# Patient Record
Sex: Female | Born: 1960
Health system: Southern US, Community
[De-identification: ages and names within clinical notes are randomized; demographics above are authoritative.]

## PROBLEM LIST (undated history)

## (undated) DIAGNOSIS — E785 Hyperlipidemia, unspecified: Secondary | ICD-10-CM

## (undated) DIAGNOSIS — I10 Essential (primary) hypertension: Secondary | ICD-10-CM

## (undated) DIAGNOSIS — G9332 Myalgic encephalomyelitis/chronic fatigue syndrome: Secondary | ICD-10-CM

## (undated) DIAGNOSIS — R5382 Chronic fatigue, unspecified: Secondary | ICD-10-CM

## (undated) DIAGNOSIS — R5383 Other fatigue: Secondary | ICD-10-CM

## (undated) DIAGNOSIS — E039 Hypothyroidism, unspecified: Secondary | ICD-10-CM

## (undated) DIAGNOSIS — R7303 Prediabetes: Secondary | ICD-10-CM

## (undated) DIAGNOSIS — E049 Nontoxic goiter, unspecified: Secondary | ICD-10-CM

## (undated) HISTORY — DX: Chronic fatigue, unspecified: R53.82

## (undated) HISTORY — DX: Essential (primary) hypertension: I10

## (undated) HISTORY — DX: Other fatigue: R53.83

## (undated) HISTORY — DX: Hyperlipidemia, unspecified: E78.5

## (undated) HISTORY — DX: Myalgic encephalomyelitis/chronic fatigue syndrome: G93.32

## (undated) HISTORY — PX: TONSILLECTOMY: SUR1361

## (undated) HISTORY — PX: FOOT SURGERY: SHX648

## (undated) HISTORY — DX: Prediabetes: R73.03

## (undated) HISTORY — DX: Hypothyroidism, unspecified: E03.9

## (undated) HISTORY — DX: Nontoxic goiter, unspecified: E04.9

---

## 2007-04-10 ENCOUNTER — Other Ambulatory Visit: Admission: RE | Admit: 2007-04-10 | Discharge: 2007-04-10 | Payer: Self-pay | Admitting: Obstetrics and Gynecology

## 2008-05-13 ENCOUNTER — Other Ambulatory Visit: Admission: RE | Admit: 2008-05-13 | Discharge: 2008-05-13 | Payer: Self-pay | Admitting: Obstetrics and Gynecology

## 2012-07-30 ENCOUNTER — Encounter: Payer: Self-pay | Admitting: Obstetrics & Gynecology

## 2012-07-31 ENCOUNTER — Ambulatory Visit: Payer: Self-pay | Admitting: Obstetrics & Gynecology

## 2012-07-31 ENCOUNTER — Ambulatory Visit (INDEPENDENT_AMBULATORY_CARE_PROVIDER_SITE_OTHER): Payer: Managed Care, Other (non HMO) | Admitting: Obstetrics & Gynecology

## 2012-07-31 ENCOUNTER — Encounter: Payer: Self-pay | Admitting: Obstetrics & Gynecology

## 2012-07-31 VITALS — BP 94/46 | HR 58 | Resp 12 | Ht 63.75 in | Wt 149.4 lb

## 2012-07-31 DIAGNOSIS — Z01419 Encounter for gynecological examination (general) (routine) without abnormal findings: Secondary | ICD-10-CM

## 2012-07-31 DIAGNOSIS — Z0000 Encounter for general adult medical examination without abnormal findings: Secondary | ICD-10-CM

## 2012-07-31 LAB — COMPREHENSIVE METABOLIC PANEL
BUN: 13 mg/dL (ref 6–23)
CO2: 27 mEq/L (ref 19–32)
Calcium: 9.2 mg/dL (ref 8.4–10.5)
Chloride: 102 mEq/L (ref 96–112)
Creat: 0.8 mg/dL (ref 0.50–1.10)
Total Bilirubin: 0.9 mg/dL (ref 0.3–1.2)

## 2012-07-31 LAB — POCT URINALYSIS DIPSTICK
Leukocytes, UA: NEGATIVE
pH, UA: 5.5

## 2012-07-31 LAB — LIPID PANEL
Cholesterol: 184 mg/dL (ref 0–200)
HDL: 57 mg/dL (ref >39)
Triglycerides: 78 mg/dL (ref <150)

## 2012-07-31 LAB — TSH: TSH: 1.026 u[IU]/mL (ref 0.350–4.500)

## 2012-07-31 NOTE — Patient Instructions (Signed)

## 2012-07-31 NOTE — Progress Notes (Signed)
52 y.o. [id number] Unknown Caucasian Fe here for annual exam.  Has been very regular in regards to cycle except for May--two cycles that month.  None this month.  Will check thyroid today.  Reports about one month ago had URI, fever, and was on Abx.  Also noted tender enlargement under arm.  Went away after finishing abx.  She has MMG today.    Patient's last menstrual period was 06/25/2012.          Sexually active: yes  The current method of family planning is none.    Exercising: yes  walking and weights  Smoker:  no  Health Maintenance: Pap:  07/10/2011 nl Pap and negative HPV MMG:  07/31/2012 Colonoscopy:  05/30/2010, f/U 5 years Dr. Sabino Dick BMD:   never TDaP:  Pt unsure  Labs: Hgb- 13.3    reports that she has never smoked. She has never used smokeless tobacco. She reports that she drinks about 0.6 ounces of alcohol per week. She reports that she does not use illicit drugs.  Past Medical History  Diagnosis Date  . Hypothyroid     Goiter  . Goiter     History reviewed. No pertinent past surgical history.  Current Outpatient Prescriptions  Medication Sig Dispense Refill  . Calcium 600 MG tablet Take 600 mg by mouth.      . cholecalciferol (VITAMIN D) 1000 UNITS tablet Take 1,000 Units by mouth daily.      [name] levothyroxine (SYNTHROID) 50 MCG tablet Take 50 mcg by mouth daily before breakfast.      . Multiple Vitamins-Minerals (MULTIVITAMIN PO) Take by mouth.       No current facility-administered medications for this visit.    Family History  Problem Relation Age of Onset  . Hypertension Father   . Colon cancer Maternal Grandmother   . Diabetes Paternal Grandmother     ROS:  Pertinent items are noted in HPI.  Otherwise, a comprehensive ROS was negative.  Exam:   BP 94/46  Pulse 58  Resp 12  Ht 5' 3.75" (1.619 m)  Wt 149 lb 6.4 oz (67.767 kg)  BMI 25.85 kg/m2  LMP 06/25/2012 Height: 5' 3.75" (161.9 cm)  Ht Readings from Last 3 Encounters:  07/31/12 5' 3.75" (1.619 m)     General appearance: alert, cooperative and appears stated age Head: Normocephalic, without obvious abnormality, atraumatic Neck: no adenopathy, supple, symmetrical, trachea midline and thyroid enlarged and right lobe > left Lungs: clear to auscultation bilaterally Breasts: normal appearance, no masses or tenderness Heart: regular rate and rhythm Abdomen: soft, non-tender; no masses,  no organomegaly Extremities: extremities normal, atraumatic, no cyanosis or edema Skin: Skin color, texture, turgor normal. No rashes or lesions Lymph nodes: Cervical, supraclavicular, and axillary nodes normal. No abnormal inguinal nodes palpated Neurologic: Grossly normal   Pelvic: External genitalia:  no lesions              Urethra:  normal appearing urethra with no masses, tenderness or lesions              Bartholin's and Skene's: normal                 Vagina: normal appearing vagina with normal color and discharge, no lesions              Cervix: no lesions              Pap taken: no Bimanual Exam:  Uterus:  normal size, contour, position, consistency, mobility, non-tender  Adnexa: normal adnexa and no mass, fullness, tenderness               Rectovaginal: Confirms               Anus:  normal sphincter tone, no lesions  A:  Well Woman with normal exam Hypothyroidism Enlarged thyroid R>L , U/S 2013 confirmed physical exam findings.  Was similar to tyroid ultrasound in paper chart from 2001  P:   Pap smear with neg HR HPV one year ago  Mammogram today FLP, TSH, CMP return annually or prn  An After Visit Summary was printed and given to the patient.

## 2012-08-01 ENCOUNTER — Telehealth: Payer: Self-pay

## 2012-08-01 NOTE — Telephone Encounter (Signed)
Patient notified of all results. 

## 2012-08-01 NOTE — Telephone Encounter (Signed)
Message copied by Elisha Headland on Thu Aug 01, 2012 10:03 AM ------      Message from: Jerene Bears      Created: Thu Aug 01, 2012  9:10 AM       Inform pt labs are good.  Alk phos is low--this is nothing to worry about.  LDL 111 but total good, HDLs good. ------

## 2012-08-01 NOTE — Telephone Encounter (Signed)
6/25 lm with husband to have patient call back//kn

## 2012-08-02 ENCOUNTER — Telehealth: Payer: Self-pay | Admitting: Obstetrics & Gynecology

## 2012-08-02 ENCOUNTER — Ambulatory Visit (INDEPENDENT_AMBULATORY_CARE_PROVIDER_SITE_OTHER): Payer: Managed Care, Other (non HMO) | Admitting: Obstetrics & Gynecology

## 2012-08-02 ENCOUNTER — Encounter: Payer: Self-pay | Admitting: Obstetrics & Gynecology

## 2012-08-02 DIAGNOSIS — E039 Hypothyroidism, unspecified: Secondary | ICD-10-CM

## 2012-08-02 MED ORDER — LEVOTHYROXINE SODIUM 50 MCG PO TABS: 75.0000 ug | ORAL_TABLET | Freq: Every day | ORAL | Status: DC

## 2012-08-02 MED ORDER — LEVOTHYROXINE SODIUM 75 MCG PO TABS: 75.0000 ug | ORAL_TABLET | Freq: Every day | ORAL | Status: DC

## 2012-08-02 NOTE — Telephone Encounter (Signed)
Patient wants to speak with the nurse re: Synthroid RX. Patient states wrong dosage written on AVS from last visit.

## 2012-08-02 NOTE — Progress Notes (Signed)
Rx for Levothyroxine to [facility] pharmacy

## 2012-08-02 NOTE — Telephone Encounter (Signed)
Spoke with pt about synthroid dosage. Pt says she takes 75 mcg daily, but her AVS stated .50. Pt confused. Advised pt that the correct dose is in the computer and was refilled correctly, so the AVS must just be a typo. Pt appreciative.

## 2012-09-20 ENCOUNTER — Ambulatory Visit (INDEPENDENT_AMBULATORY_CARE_PROVIDER_SITE_OTHER): Payer: Managed Care, Other (non HMO) | Admitting: Obstetrics & Gynecology

## 2012-09-20 ENCOUNTER — Encounter: Payer: Self-pay | Admitting: Obstetrics & Gynecology

## 2012-09-20 VITALS — BP 116/68 | HR 64 | Resp 16 | Ht 63.75 in | Wt 149.2 lb

## 2012-09-20 DIAGNOSIS — N9489 Other specified conditions associated with female genital organs and menstrual cycle: Secondary | ICD-10-CM

## 2012-09-20 DIAGNOSIS — N9089 Other specified noninflammatory disorders of vulva and perineum: Secondary | ICD-10-CM

## 2012-09-20 DIAGNOSIS — A630 Anogenital (venereal) warts: Secondary | ICD-10-CM

## 2012-09-20 MED ORDER — CEPHALEXIN 500 MG PO CAPS: 500.0000 mg | ORAL_CAPSULE | Freq: Four times a day (QID) | ORAL | Status: DC

## 2012-09-20 NOTE — Patient Instructions (Signed)

## 2012-09-20 NOTE — Progress Notes (Signed)
53 y.o. MWF here for removal of left labial lesion.  This appears to be a wart but she knows I will sent to pathology for final analysis.  On cycle today.  Procedure described to patient.  Informed consent obtained.  All questions answered before proceeding.    Exam:   BP 116/68  Pulse 64  Resp 16  Ht 5' 3.75" (1.619 m)  Wt 149 lb 3.2 oz (67.677 kg)  BMI 25.82 kg/m2  LMP 09/17/2012 General appearance: alert and cooperative Lymph:  Inguinal adenopathy: neg  External genitalia:  Raised, warty lesion on left inferior aspect of labia majora  Procedure:  Area cleansed with Betadine.  Sterile technique used throughout procedure.  Skin anesthestized with Lidocaine 1% plain;  1mL.  Total amount used:  1 cc's.  Lesion elevated with pick-ups and excised fully with sterile scissors.  Lesion about 5mm in size.  Two figure of eight sutures required for excellent hemostasis.  No dressing applied.  Patient tolerated procedure well.    Specimen(s) labeled as labia majora right and sent to pathology  Assessment:  Vulvar lesion, consistent with condyloma  Plan:  Return for suture removal 1 week Keflex 500mg  qid x 5 days. Instructions provided

## 2012-09-24 ENCOUNTER — Telehealth: Payer: Self-pay

## 2012-09-24 NOTE — Telephone Encounter (Signed)
lmtcb

## 2012-09-24 NOTE — Telephone Encounter (Signed)
Message copied by Elisha Headland on Tue Sep 24, 2012  2:19 PM ------      Message from: Jerene Bears      Created: Mon Sep 23, 2012  4:30 PM       Inform pathology showed a wart.  This is what she and I both thought it was.  She is coming for suture removal Thursday pm. ------

## 2012-09-24 NOTE — Telephone Encounter (Signed)
Patient notified of all results. Will f/u on Thurs for suture removal.

## 2012-09-26 ENCOUNTER — Ambulatory Visit (INDEPENDENT_AMBULATORY_CARE_PROVIDER_SITE_OTHER): Payer: Managed Care, Other (non HMO) | Admitting: Obstetrics & Gynecology

## 2012-09-26 ENCOUNTER — Encounter: Payer: Self-pay | Admitting: Obstetrics & Gynecology

## 2012-09-26 VITALS — BP 110/64 | HR 64 | Resp 16 | Ht 63.75 in | Wt 148.0 lb

## 2012-09-26 DIAGNOSIS — A630 Anogenital (venereal) warts: Secondary | ICD-10-CM

## 2012-09-26 DIAGNOSIS — R635 Abnormal weight gain: Secondary | ICD-10-CM

## 2012-09-27 ENCOUNTER — Other Ambulatory Visit: Payer: Self-pay | Admitting: Obstetrics & Gynecology

## 2012-09-27 NOTE — Progress Notes (Signed)
Subjective:     Patient ID: Deborah Miranda, female   DOB: Jun 07, 1960, 52 y.o.   MRN: [id number]  HPI 52 yo MWF here for suture removal after excision of vulvar lesion.  Pathology showed condyloma.  D/W pt.  Many questions.  All answered.    She wants to have additional thyroid testing done due to weight gain.  Feels TSH is not enough.  Has been reading book about T3.  Asks is needs referral for this test.    Review of Systems  All other systems reviewed and are negative.       Objective:   Physical Exam  Constitutional: She appears well-developed and well-nourished.  Genitourinary:          Assessment:     Vulvar Condyloma Weight gain     Plan:     Return for AEX Thyroid panel ordered

## 2012-09-28 LAB — THYROID PROFILE - CHCC: T4, Total: 8.7 ug/dL (ref 5.0–12.5)

## 2012-10-01 ENCOUNTER — Telehealth: Payer: Self-pay

## 2012-10-01 NOTE — Telephone Encounter (Signed)
Message copied by Elisha Headland on Tue Oct 01, 2012  3:49 PM ------      Message from: Jerene Bears      Created: Sat Sep 28, 2012  8:00 PM       Please inform thyroid panel is completely normal. She does not have hypothyroidism. ------

## 2012-10-01 NOTE — Telephone Encounter (Signed)
Patient returning your phone call.   

## 2012-10-01 NOTE — Telephone Encounter (Signed)
8/26 lmtcb

## 2012-10-03 NOTE — Telephone Encounter (Signed)
Returning a call to Kelly °

## 2012-10-04 NOTE — Telephone Encounter (Signed)
Patient returning Kelly's call. °

## 2012-10-04 NOTE — Telephone Encounter (Signed)
Patient aware of all results. 

## 2013-08-15 ENCOUNTER — Other Ambulatory Visit: Payer: Self-pay | Admitting: Obstetrics & Gynecology

## 2013-08-18 NOTE — Telephone Encounter (Signed)
Last refilled: 08/02/12 #90/4 refills Last AEX: 07/31/13 with Dr. Hyacinth MeekerMiller Last Level Check 07/31/12 at 1.026 AEX scheduled for 09/26/13 with Dr. Hyacinth MeekerMiller Synthroid 75 mcg #90/0 refills sent to pharmacy to last patient until AEX

## 2013-09-09 ENCOUNTER — Other Ambulatory Visit: Payer: Self-pay | Admitting: Obstetrics & Gynecology

## 2013-09-09 NOTE — Telephone Encounter (Addendum)
Rx refilled on 08/18/13 #90, 1 refill. Pt should have enough to last until her AEX on 09/26/13

## 2013-09-26 ENCOUNTER — Encounter: Payer: Self-pay | Admitting: Obstetrics & Gynecology

## 2013-09-26 ENCOUNTER — Ambulatory Visit (INDEPENDENT_AMBULATORY_CARE_PROVIDER_SITE_OTHER): Payer: BC Managed Care – PPO | Admitting: Obstetrics & Gynecology

## 2013-09-26 VITALS — BP 122/78 | HR 60 | Resp 16 | Ht 64.0 in | Wt 150.8 lb

## 2013-09-26 DIAGNOSIS — Z0000 Encounter for general adult medical examination without abnormal findings: Secondary | ICD-10-CM

## 2013-09-26 DIAGNOSIS — N9089 Other specified noninflammatory disorders of vulva and perineum: Secondary | ICD-10-CM

## 2013-09-26 DIAGNOSIS — Z124 Encounter for screening for malignant neoplasm of cervix: Secondary | ICD-10-CM

## 2013-09-26 DIAGNOSIS — E039 Hypothyroidism, unspecified: Secondary | ICD-10-CM

## 2013-09-26 DIAGNOSIS — Z01419 Encounter for gynecological examination (general) (routine) without abnormal findings: Secondary | ICD-10-CM

## 2013-09-26 LAB — POCT URINALYSIS DIPSTICK
Bilirubin, UA: NEGATIVE
Blood, UA: NEGATIVE
GLUCOSE UA: NEGATIVE
LEUKOCYTES UA: NEGATIVE
NITRITE UA: NEGATIVE
Protein, UA: NEGATIVE
UROBILINOGEN UA: NEGATIVE
pH, UA: 5

## 2013-09-26 LAB — HEMOGLOBIN, FINGERSTICK: HEMOGLOBIN, FINGERSTICK: 13.8 g/dL (ref 12.0–16.0)

## 2013-09-26 MED ORDER — LEVOTHYROXINE SODIUM 75 MCG PO TABS
ORAL_TABLET | ORAL | Status: DC
Start: 2013-09-26 — End: 2014-10-01

## 2013-09-26 NOTE — Progress Notes (Signed)
53 y.o. G92P2002 Married CaucasianF here for annual exam.  Hopefully going to the [place] to an all inclusive resort.  This is her preferred way of traveling.  Cycles are completely normal and regular, lasting 4 days.  Has place on her right inner thigh she'd like me to look at.  It is very close to the crease in her leg/groin area.  Pt reports it is pigmented but a few weeks ago became much larger and "swollen".  She would like this removed if possible.   Patient's last menstrual period was 09/17/2013.          Sexually active: Yes.    The current method of family planning is none.    Exercising: Yes.     Smoker:  no  Health Maintenance: Pap:  07/06/11 WNL/negative HR HPV History of abnormal Pap:  no MMG:  09/26/13 Colonoscopy:  05/30/10-repeat in 5 years BMD:   none TDaP:  Up to date Screening Labs: today, Hb today: today, Urine today: KETONES-trace   reports that she has never smoked. She has never used smokeless tobacco. She reports that she does not drink alcohol or use illicit drugs.  Past Medical History  Diagnosis Date  . Hypothyroid     Goiter  . Goiter     Past Surgical History  Procedure Laterality Date  . Foot surgery      pinkie toe    Current Outpatient Prescriptions  Medication Sig Dispense Refill  . Calcium 600 MG tablet Take 600 mg by mouth.      . cholecalciferol (VITAMIN D) 1000 UNITS tablet Take 1,000 Units by mouth daily.      . Multiple Vitamins-Minerals (MULTIVITAMIN PO) Take by mouth.      . SYNTHROID 75 MCG tablet TAKE 1 TABLET BY MOUTH EVERY DAY BEFORE BREAKFAST  90 tablet  1   No current facility-administered medications for this visit.    Family History  Problem Relation Age of Onset  . Hypertension Father   . Colon cancer Maternal Grandmother   . Cancer Maternal Grandmother     colon cancer  . Diabetes Paternal Grandmother     ROS:  Pertinent items are noted in HPI.  Otherwise, a comprehensive ROS was negative.  Exam:   BP  122/78  Pulse 60  Resp 16  Ht 5\' 4"  (1.626 m)  Wt 150 lb 12.8 oz (68.402 kg)  BMI 25.87 kg/m2  LMP 09/17/2013  Weight change: stable   Height: 5\' 4"  (162.6 cm)  Ht Readings from Last 3 Encounters:  09/26/13 5\' 4"  (1.626 m)  09/26/12 5' 3.75" (1.619 m)  09/20/12 5' 3.75" (1.619 m)    General appearance: alert, cooperative and appears stated age Head: Normocephalic, without obvious abnormality, atraumatic Neck: no adenopathy, supple, symmetrical, trachea midline and thyroid normal to inspection and palpation Lungs: clear to auscultation bilaterally Breasts: normal appearance, no masses or tenderness Heart: regular rate and rhythm Abdomen: soft, non-tender; bowel sounds normal; no masses,  no organomegaly Extremities: extremities normal, atraumatic, no cyanosis or edema Skin: Skin color, texture, turgor normal. No rashes or lesions Lymph nodes: Cervical, supraclavicular, and axillary nodes normal. No abnormal inguinal nodes palpated Neurologic: Grossly normal   Pelvic: External genitalia:  no lesions except for raised and irregular reddish lesion right inner thigh              Urethra:  normal appearing urethra with no masses, tenderness or lesions  Bartholins and Skenes: normal                 Vagina: normal appearing vagina with normal color and discharge, no lesions              Cervix: no lesions              Pap taken: Yes.   Bimanual Exam:  Uterus:  normal size, contour, position, consistency, mobility, non-tender              Adnexa: normal adnexa and no mass, fullness, tenderness               Rectovaginal: Confirms               Anus:  normal sphincter tone, no lesions  Procedure:  Area on right thigh cleansed with Betadine x 3.  Using sterile techinque, lesion excision with sterile pick-ups and scissors.  Two interrupted sutures of #4.0 vicryl placed.  Hemostasis present.  Pt tolerated procedure well.  Dressing applied.  A:  Well Woman with normal  exam Hypothyroidism S/p excision of right inner thigh lesion  P:   Mammogram yearly pap smear obtained today.  Neg pap with neg HR HPV 2013 Vit D, TSH, CMP, Lipids today Pathology results will be called to pt.  Stitches will need to be removed 7-10 days or when area is itching (due to healing) return annually or prn  An After Visit Summary was printed and given to the patient.

## 2013-09-27 LAB — COMPREHENSIVE METABOLIC PANEL WITH GFR
ALT: 11 U/L (ref 0–35)
AST: 16 U/L (ref 0–37)
Albumin: 4.6 g/dL (ref 3.5–5.2)
Alkaline Phosphatase: 40 U/L (ref 39–117)
BUN: 13 mg/dL (ref 6–23)
CO2: 27 meq/L (ref 19–32)
Calcium: 9.3 mg/dL (ref 8.4–10.5)
Chloride: 100 meq/L (ref 96–112)
Creat: 0.68 mg/dL (ref 0.50–1.10)
Glucose, Bld: 73 mg/dL (ref 70–99)
Potassium: 4 meq/L (ref 3.5–5.3)
Sodium: 139 meq/L (ref 135–145)
Total Bilirubin: 1.3 mg/dL — ABNORMAL HIGH (ref 0.2–1.2)
Total Protein: 7 g/dL (ref 6.0–8.3)

## 2013-09-27 LAB — LIPID PANEL
Cholesterol: 234 mg/dL — ABNORMAL HIGH (ref 0–200)
HDL: 72 mg/dL
LDL Cholesterol: 146 mg/dL — ABNORMAL HIGH (ref 0–99)
Total CHOL/HDL Ratio: 3.3 ratio
Triglycerides: 79 mg/dL
VLDL: 16 mg/dL (ref 0–40)

## 2013-09-27 LAB — TSH: TSH: 2.889 u[IU]/mL (ref 0.350–4.500)

## 2013-09-27 LAB — VITAMIN D 25 HYDROXY (VIT D DEFICIENCY, FRACTURES): Vit D, 25-Hydroxy: 79 ng/mL (ref 30–89)

## 2013-09-28 ENCOUNTER — Encounter: Payer: Self-pay | Admitting: Obstetrics & Gynecology

## 2013-09-28 DIAGNOSIS — E039 Hypothyroidism, unspecified: Secondary | ICD-10-CM | POA: Insufficient documentation

## 2013-09-30 LAB — IPS PAP TEST WITH REFLEX TO HPV

## 2013-10-03 ENCOUNTER — Telehealth: Payer: Self-pay

## 2013-10-03 NOTE — Telephone Encounter (Signed)
Message copied by Elisha Headland on Fri Oct 03, 2013 12:33 PM ------      Message from: Jerene Bears      Created: Thu Oct 02, 2013  8:35 AM       Inform pt pap negative.  Excised lesion was another condyloma with some mild dysplasia in it.  Nothing else needs to be done unless returns.  CMP showed bilirubin level mildly elevated.  Normal last year so will just recheck in one year.  Lipids mildly elevated, specifically LDL.  HDLs and TGs good.  Ratio normal.  Will continue to watch.  Really should try and check fasting one year.  She comes from [place] so this may be hard.  TSH normal.  Vit D normal.  If she is taking any supplements with Vit D, she should decrease by 1/2 as level is high normal.  If not on supplements, he body just makes a good amount and that is fine. ------

## 2013-10-03 NOTE — Telephone Encounter (Signed)
Lmtcb//kn 

## 2013-10-30 NOTE — Telephone Encounter (Signed)
Patient calling to speak with Deborah Miranda stating she did not receive her lab results in the mail.

## 2013-12-08 ENCOUNTER — Encounter: Payer: Self-pay | Admitting: Obstetrics & Gynecology

## 2013-12-22 NOTE — Telephone Encounter (Signed)
Patient sent in signed release-faxed to provider.//kn

## 2013-12-22 NOTE — Telephone Encounter (Signed)
Patient notified of all results earlier.//kn

## 2014-04-16 ENCOUNTER — Telehealth: Payer: Self-pay | Admitting: Obstetrics & Gynecology

## 2014-04-16 NOTE — Telephone Encounter (Signed)
Patient wants to talk with the nurse about having her hormone levels checked.

## 2014-04-16 NOTE — Telephone Encounter (Signed)
Spoke with patient. Patient states "I have been working out and eating right and I can not lose weight. I read that it could be related to my hormone levels. I just want to have everything checked." Advised patient will need to be seen in office with Dr.Miller for evaluation and blood work. Patient is agreeable. Appointment scheduled for 3/24 at 10:45am with Dr.Miller. Patient is agreeable to date and time.  Routing to provider for final review. Patient agreeable to disposition. Will close encounter

## 2014-04-30 ENCOUNTER — Ambulatory Visit (INDEPENDENT_AMBULATORY_CARE_PROVIDER_SITE_OTHER): Payer: BLUE CROSS/BLUE SHIELD | Admitting: Obstetrics & Gynecology

## 2014-04-30 VITALS — BP 134/80 | HR 64 | Resp 16 | Wt 162.0 lb

## 2014-04-30 DIAGNOSIS — R635 Abnormal weight gain: Secondary | ICD-10-CM | POA: Diagnosis not present

## 2014-04-30 DIAGNOSIS — N951 Menopausal and female climacteric states: Secondary | ICD-10-CM | POA: Diagnosis not present

## 2014-04-30 DIAGNOSIS — R5383 Other fatigue: Secondary | ICD-10-CM | POA: Diagnosis not present

## 2014-04-30 NOTE — Progress Notes (Signed)
Patient ID: Deborah Miranda, female   DOB: 09-05-1960, 54 y.o.   MRN: [id number]  54 yo G2P2 MWF here for discussion of desired weight loss.  Reports she is exercising really regularly.  She is doing the eliptical almost every morning.  She is walking in the evening a couple of times a week--walks 2 miles--as well as Yoga a few times a week.  She is just frustrated by weight changes.  Pt has changed 12 pounds in the last year.  Also, she is watching her diet carefully.  Pt does not drink sodas.  Also just has ETOH socially.    She reports she does feel fatigue a lot.  She doesn't have the best sleep habits.  She reports she can fall asleep really easily but wakes up between 2-4am and has trouble getting back to sleep.  Took Melatonin but made her too groggy during the day so she stopped it.    Pt has been menopausal for several years and is not on HRT.  She has never wanted to be on it due to risks.  States she wants hormonal testing as she thinks her hormones must be "out of wack".  Discussed with pt they will all be low as she is not making any--therefore, menopausal.  She understands this but still wants to make sure they are in the "normal" range for where she is in menopause.  Pt really wants to lose 10 pounds.  Has husband with her to help vouch for her good eating habits.  D/W pt phentermine use.  Could start at 15mg  dosage and use daily.  Risks of hypertension, pulmonary hypertension, dizziness, insomnia, stroke all discussed.  She will need to be seen every 4-8 weeks while on this.  Pt and I agree to wait and see what lab results show first.  Assessment:  Menopausal weight gain Fatigue  Plan:  Estradiol and Progesterone levels Ferritin, TSH and panel, B 12 If above is in appropriate ranges, will consider phentermine 15mg  daily.    ~15 minutes spent with patient >50% of time was in face to face discussion of above.

## 2014-05-01 LAB — PROGESTERONE: Progesterone: 0.5 ng/mL

## 2014-05-01 LAB — THYROID PANEL WITH TSH
Free Thyroxine Index: 2.4 (ref 1.4–3.8)
T3 UPTAKE: 30 % (ref 22–35)
T4, Total: 7.9 ug/dL (ref 4.5–12.0)
TSH: 1.609 u[IU]/mL (ref 0.350–4.500)

## 2014-05-01 LAB — FERRITIN: Ferritin: 15 ng/mL (ref 10–291)

## 2014-05-01 LAB — VITAMIN B12: Vitamin B-12: 604 pg/mL (ref 211–911)

## 2014-05-01 LAB — ESTRADIOL: ESTRADIOL: 43.7 pg/mL

## 2014-05-06 ENCOUNTER — Telehealth: Payer: Self-pay | Admitting: Obstetrics & Gynecology

## 2014-05-06 MED ORDER — PHENTERMINE HCL 15 MG PO CAPS: 15.0000 mg | ORAL_CAPSULE | ORAL | Status: DC

## 2014-05-06 NOTE — Telephone Encounter (Signed)
Rx for phentermine 15 mg daily #30 1RF to Dr.Silva for review and signature as Dr.Miller is out of the office today.

## 2014-05-06 NOTE — Telephone Encounter (Signed)
Rx was on TXU CorpKelly's desk and signed yesterday.  She may not have faxed it yesterday but it should be there.  Sorry Dr. Edward JollySilva had to do this.

## 2014-05-06 NOTE — Telephone Encounter (Signed)
Pt calling for test results.

## 2014-05-06 NOTE — Telephone Encounter (Signed)
Rx for Phentermine signed by me.

## 2014-05-06 NOTE — Telephone Encounter (Signed)
Addendum opened in error.

## 2014-05-06 NOTE — Telephone Encounter (Addendum)
Spoke with patient. Advised of results and message as seen below from Dr.Miller. Patient is agreeable and verbalizes understanding. Rx for Phentermine 15mg  daily #40 1RF sent to pharmacy on file. Patient is agreeable. Patient would like to call back tomorrow to schedule follow up appointment with Dr.Miller. "When I was in we also talked about me starting to take some herbs or vitamins. I do not know what kind though." Advised I will speak with Dr.Miller regarding recommendations and return call. Patient is agreeable. Patient requests labs be sent to home address on file. Lab flowsheet with results from 3/24 sent to patient's address on file.  Notes Recorded by Jerene BearsMary S Miller, MD on 05/05/2014 at 1:05 PM Please inform pt that b12, ferritin, thyroid panel were normal. Hormones were lower like I expected. We agreed to start PHentermine 15mg  daily. She will need follow up in 4-6 weeks after she starts for recheck with me. Thanks.

## 2014-05-06 NOTE — Telephone Encounter (Signed)
Prescription faxed to [facility] on file at [phone] with cover sheet and confirmation.  Routing to provider for final review. Patient agreeable to disposition. Will close encounter

## 2014-05-07 ENCOUNTER — Encounter: Payer: Self-pay | Admitting: Obstetrics & Gynecology

## 2014-05-15 ENCOUNTER — Telehealth: Payer: Self-pay | Admitting: Obstetrics & Gynecology

## 2014-05-15 NOTE — Telephone Encounter (Signed)
Left message to call Kaitlyn at [phone]. 

## 2014-05-15 NOTE — Telephone Encounter (Signed)
I would not stop for 3-4 weeks.  If she hasn't lost any weight over three to four weeks, then would advise stopping.

## 2014-05-15 NOTE — Telephone Encounter (Signed)
Pt says the medication Dr Hyacinth MeekerMiller prescribed at her last visit is not working. She thought she was going to get information on nutritional supplements as well.

## 2014-05-15 NOTE — Telephone Encounter (Signed)
Spoke with patient. Patient states that she has been taking Phentermine 15mg  daily and has not noticed a difference. "Nothing has changed at all. I have not lost any weight. I have been dieting and exercising and have not lost a single pound." Advised patient with it only being a week may need longer for this to take effect. "I do not want to keep taking it if it is not working." Advised I will speak with Dr.Miller regarding medication and return call. Patient is agreeable.

## 2014-05-18 NOTE — Telephone Encounter (Signed)
Advised patient of message from Dr. Hyacinth MeekerMiller. She will call back with update after 3-4 weeks.Routing to provider for final review. Patient agreeable to disposition. Will close encounter

## 2014-05-27 ENCOUNTER — Telehealth: Payer: Self-pay | Admitting: Obstetrics & Gynecology

## 2014-05-27 NOTE — Telephone Encounter (Signed)
Spoke with patient. She has not lost any weight at all on Phentermine 15 mg. She states she is taking every day and healthy diet with exercise. Calling to get advise from Dr. Hyacinth MeekerMiller.  Advised per last note, would advise to stop and patient agrees, but would like further information. Patient is wondering if she needs office visit or additional lab work?

## 2014-05-27 NOTE — Telephone Encounter (Signed)
Patient wants to talk with nurse. She states she thinks Dr. Hyacinth MeekerMiller wants her to come back in for a follow up or recheck appointment.

## 2014-05-28 NOTE — Telephone Encounter (Signed)
Called patient with message from Dr. Hyacinth MeekerMiller and she is agreeable. She will follow up prn. Routing to provider for final review. Patient agreeable to disposition. Will close encounter

## 2014-05-28 NOTE — Telephone Encounter (Signed)
She does not need additional blood work.  Should stop the medication if hasn't had any weight loss.  Could consider physician monitored weight loss facility.  I do not know what is close to her home, however.

## 2014-10-01 ENCOUNTER — Other Ambulatory Visit: Payer: Self-pay | Admitting: Obstetrics & Gynecology

## 2014-10-01 NOTE — Telephone Encounter (Signed)
Medication refill request: levothyroxine Last AEX:  09/26/13 with SM Next AEX: 10/02/14 with SM Last MMG (if hormonal medication request):  Refill authorized: please advise.

## 2014-10-02 ENCOUNTER — Ambulatory Visit (INDEPENDENT_AMBULATORY_CARE_PROVIDER_SITE_OTHER): Payer: 59 | Admitting: Obstetrics & Gynecology

## 2014-10-02 ENCOUNTER — Encounter: Payer: Self-pay | Admitting: Obstetrics & Gynecology

## 2014-10-02 VITALS — BP 122/80 | HR 64 | Resp 16 | Ht 64.5 in | Wt 156.0 lb

## 2014-10-02 DIAGNOSIS — Z0000 Encounter for general adult medical examination without abnormal findings: Secondary | ICD-10-CM

## 2014-10-02 DIAGNOSIS — Z124 Encounter for screening for malignant neoplasm of cervix: Secondary | ICD-10-CM

## 2014-10-02 DIAGNOSIS — N938 Other specified abnormal uterine and vaginal bleeding: Secondary | ICD-10-CM

## 2014-10-02 DIAGNOSIS — Z01419 Encounter for gynecological examination (general) (routine) without abnormal findings: Secondary | ICD-10-CM | POA: Diagnosis not present

## 2014-10-02 LAB — LIPID PANEL
CHOL/HDL RATIO: 3.3 ratio (ref <=5.0)
Cholesterol: 225 mg/dL — ABNORMAL HIGH (ref 125–200)
HDL: 69 mg/dL (ref >=46)
LDL Cholesterol: 141 mg/dL — ABNORMAL HIGH (ref <130)
Triglycerides: 76 mg/dL (ref <150)
VLDL: 15 mg/dL (ref <30)

## 2014-10-02 LAB — POCT URINALYSIS DIPSTICK
Bilirubin, UA: NEGATIVE
Blood, UA: NEGATIVE
Glucose, UA: NEGATIVE
KETONES UA: NEGATIVE
Leukocytes, UA: NEGATIVE
Nitrite, UA: NEGATIVE
PROTEIN UA: NEGATIVE
Urobilinogen, UA: NEGATIVE
pH, UA: 6.5

## 2014-10-02 LAB — TSH: TSH: 8.661 u[IU]/mL — ABNORMAL HIGH (ref 0.350–4.500)

## 2014-10-02 LAB — COMPREHENSIVE METABOLIC PANEL
ALBUMIN: 4.5 g/dL (ref 3.6–5.1)
ALT: 11 U/L (ref 6–29)
AST: 14 U/L (ref 10–35)
Alkaline Phosphatase: 45 U/L (ref 33–130)
BILIRUBIN TOTAL: 0.9 mg/dL (ref 0.2–1.2)
BUN: 13 mg/dL (ref 7–25)
CHLORIDE: 101 mmol/L (ref 98–110)
CO2: 28 mmol/L (ref 20–31)
CREATININE: 0.83 mg/dL (ref 0.50–1.05)
Calcium: 9.4 mg/dL (ref 8.6–10.4)
Glucose, Bld: 97 mg/dL (ref 65–99)
Potassium: 4.3 mmol/L (ref 3.5–5.3)
SODIUM: 138 mmol/L (ref 135–146)
Total Protein: 6.9 g/dL (ref 6.1–8.1)

## 2014-10-02 MED ORDER — PHENTERMINE HCL 15 MG PO CAPS: 15.0000 mg | ORAL_CAPSULE | ORAL | Status: DC

## 2014-10-02 MED ORDER — LEVOTHYROXINE SODIUM 75 MCG PO TABS: ORAL_TABLET | ORAL | Status: DC

## 2014-10-02 NOTE — Progress Notes (Signed)
54 y.o. [id number] MarriedCaucasianF here for annual exam.  Did skip some cycles this past year.  Cycle that occurred after this was very heavy.  Back to cycling monthly for several months.    Frustrated with weight.  Would like to try Phentermine again.  Only took two months.  She has seen a nutritionist.     Patient's last menstrual period was 09/05/2014.          Sexually active: Yes.    The current method of family planning is none.    Exercising: Yes.     Smoker:  no  Health Maintenance: Pap:  09/26/13 WNL-Endometrial cells on pap-LMP 09/17/13 History of abnormal Pap:  no MMG:  10/02/14--done this morning Colonoscopy:  05/30/10-repeat in 5 years BMD:   none TDaP:  UTD Screening Labs: today, Hb today: 13.5, Urine today: PH-6.5   reports that she has never smoked. She has never used smokeless tobacco. She reports that she does not drink alcohol or use illicit drugs.  Past Medical History  Diagnosis Date  . Hypothyroid     Goiter  . Goiter     Past Surgical History  Procedure Laterality Date  . Foot surgery      pinkie toe    Current Outpatient Prescriptions  Medication Sig Dispense Refill  . Calcium 600 MG tablet Take 600 mg by mouth.    . cholecalciferol (VITAMIN D) 1000 UNITS tablet Take 1,000 Units by mouth daily.    [name] levothyroxine (SYNTHROID, LEVOTHROID) 75 MCG tablet TAKE 1 TABLET BY MOUTH EVERY DAY BEFORE BREAKFAST 90 tablet 0  . Multiple Vitamins-Minerals (MULTIVITAMIN PO) Take by mouth.    . phentermine 15 MG capsule Take 1 capsule (15 mg total) by mouth every morning. (Patient not taking: Reported on 10/02/2014) 30 capsule 1   No current facility-administered medications for this visit.    Family History  Problem Relation Age of Onset  . Hypertension Father   . Colon cancer Maternal Grandmother   . Cancer Maternal Grandmother     colon cancer  . Diabetes Paternal Grandmother     ROS:  Pertinent items are noted in HPI.  Otherwise, a comprehensive ROS was  negative.  Exam:   BP 122/80 mmHg  Pulse 64  Resp 16  Ht 5' 4.5" (1.638 m)  Wt 156 lb (70.761 kg)  BMI 26.37 kg/m2  LMP 09/05/2014  Weight change: +6#   Height: 5' 4.5" (163.8 cm)  Ht Readings from Last 3 Encounters:  10/02/14 5' 4.5" (1.638 m)  09/26/13 5\' 4"  (1.626 m)  09/26/12 5' 3.75" (1.619 m)   General appearance: alert, cooperative and appears stated age Head: Normocephalic, without obvious abnormality, atraumatic Neck: no adenopathy, supple, symmetrical, trachea midline and thyroid normal to inspection and palpation Lungs: clear to auscultation bilaterally Breasts: normal appearance, no masses or tenderness Heart: regular rate and rhythm Abdomen: soft, non-tender; bowel sounds normal; no masses,  no organomegaly Extremities: extremities normal, atraumatic, no cyanosis or edema Skin: Skin color, texture, turgor normal. No rashes or lesions Lymph nodes: Cervical, supraclavicular, and axillary nodes normal. No abnormal inguinal nodes palpated Neurologic: Grossly normal   Pelvic: External genitalia:  no lesions              Urethra:  normal appearing urethra with no masses, tenderness or lesions              Bartholins and Skenes: normal  Vagina: normal appearing vagina with normal color and discharge, no lesions              Cervix: no lesions              Pap taken: Yes.   Bimanual Exam:  Uterus:  normal size, contour, position, consistency, mobility, non-tender              Adnexa: normal adnexa and no mass, fullness, tenderness               Rectovaginal: Confirms               Anus:  normal sphincter tone, no lesions  Chaperone was present for exam.  A:  Well Woman with normal exam Hypothyroidism S/p excision of right inner thigh lesion Continued weight gain Some irregular bleeding this past year  P: Mammogram yearly pap smear obtained today. Neg pap with neg HR HPV 2013.  Normal pap last year with endometrial cells on it.  (Pt is  still cycling) TSH, CMP, Lipids today FSH today Levothyroxine daily.  #90/4RF.  Pt may need to call back for rx to be sent electronically.  Using mail order this year but hasn't set up account yet.  Printed rx given. Restart phentermine  daily.  #30/1RF.  Pt will give update in 1-2 months.   return annually or prn

## 2014-10-03 LAB — FOLLICLE STIMULATING HORMONE: FSH: 43 m[IU]/mL

## 2014-10-05 LAB — IPS PAP TEST WITH REFLEX TO HPV

## 2014-10-06 LAB — HEMOGLOBIN, FINGERSTICK: Hemoglobin, fingerstick: 13.5 g/dL (ref 12.0–16.0)

## 2014-10-16 ENCOUNTER — Telehealth: Payer: Self-pay | Admitting: Obstetrics & Gynecology

## 2014-10-16 NOTE — Telephone Encounter (Signed)
No as she hadn't been taking it when I drew her labs.

## 2014-10-16 NOTE — Telephone Encounter (Signed)
Spoke with patient. Patient states that she spoke with Dr.Miller yesterday regarding her TSH level. Please see note from Dr.Miller below. Patient states she did some research and saw that Phentermine can cause an elevation in TSH. Patient would like to know if Dr.Miller feels this may have cause her TSH to go up. Advised I will speak with Dr.Miller and return call with further recommendations. Patient is agreeable. One month lab recheck scheduled for 11/16/2014 at 10:30 am.   Notes Recorded by Jerene Bears, MD on 10/15/2014 at 2:05 PM Called and spoke with pt. CMP and lipids reviewed. Elevated TSH at 8.6 discussed. Pt was on daily for two to three weeks before appt as was out of dosage. Pt is now on . She will increase to for the next month. Will need to repeat TSh at that time. FSH reviewed. Relation to bleeding and thyroid discussed. PUS recommended. Pt wants to see if next month makes any difference as she increases her thyroid dosage. She and I agree to wait one month. She will call back and give report. Reminder placed. 02 pap recall. Reviewed with pt as well. No symptoms of yeast. No treatment needed.

## 2014-10-16 NOTE — Telephone Encounter (Signed)
Patient wanting to speak with nurse about one of her medications.

## 2014-10-16 NOTE — Telephone Encounter (Signed)
Spoke with patient. Advised of message as seen below from Dr.Miller. Patient is agreeable and verbalizes understanding.  Routing to provider for final review. Patient agreeable to disposition. Will close encounter   

## 2014-10-20 ENCOUNTER — Ambulatory Visit: Payer: Self-pay | Admitting: Obstetrics & Gynecology

## 2014-11-11 ENCOUNTER — Telehealth: Payer: Self-pay | Admitting: Obstetrics & Gynecology

## 2014-11-11 NOTE — Telephone Encounter (Signed)
Patient called to cancel her lab appointment for 11/16/14 will call back and reschedule.

## 2014-11-16 ENCOUNTER — Other Ambulatory Visit: Payer: 59

## 2014-11-23 ENCOUNTER — Telehealth: Payer: Self-pay

## 2014-11-23 NOTE — Telephone Encounter (Signed)
-----   Message from Jerene BearsMary S Miller, MD sent at 11/15/2014 10:58 AM EDT ----- Regarding: bleeding Please call and check on pt.  See how her bleeding is.  If still occuring, she needs PUS.  Thanks.  MSM

## 2014-11-23 NOTE — Telephone Encounter (Signed)
Lmtcb//kn 

## 2014-11-24 NOTE — Telephone Encounter (Signed)
lmtcb-to let patient know Dr Hyacinth MeekerMiller does want to proceed with PUS.//kn

## 2014-11-24 NOTE — Telephone Encounter (Signed)
-----   Message from Jerene BearsMary S Miller, MD sent at 11/23/2014  5:34 PM EDT ----- Regarding: RE: bleeding Yes.  PUS.  I may do a biopsy but that doesn't need precert.  You might want to create a phone note on this one too.  Thanks.  Rosalita ChessmanSuzanne ----- Message -----    From: Elisha HeadlandKelly S Sharne Linders, CMA    Sent: 11/23/2014   3:26 PM      To: Jerene BearsMary S Miller, MD Subject: RE: bleeding                                   Dr Hyacinth MeekerMiller, her bleeding had stopped for the last 2 1/2 months. States it started again at the end of last week. I am gong to send Kriste BasqueBecky a request to precert her PUS, if this is still what you want her to do. Please advise.//kn ----- Message -----    From: Jerene BearsMary S Miller, MD    Sent: 11/15/2014  10:58 AM      To: Elisha HeadlandKelly S Cassi Jenne, CMA Subject: bleeding                                       Please call and check on pt.  See how her bleeding is.  If still occuring, she needs PUS.  Thanks.  MSM

## 2014-11-25 ENCOUNTER — Other Ambulatory Visit: Payer: Self-pay | Admitting: Obstetrics & Gynecology

## 2014-11-25 DIAGNOSIS — N950 Postmenopausal bleeding: Secondary | ICD-10-CM

## 2014-11-26 NOTE — Telephone Encounter (Signed)
Lmtcb//kn 

## 2014-11-26 NOTE — Telephone Encounter (Signed)
Returning a call to Kelly °

## 2014-11-27 NOTE — Telephone Encounter (Signed)
Called patient to review benefit and schedule. Left voicemail to return call.

## 2014-11-27 NOTE — Telephone Encounter (Signed)
Patient aware-will wait for call to schedule PUS.//kn

## 2014-12-15 NOTE — Telephone Encounter (Signed)
Called patient to review benefits for procedure. Left voicemail to return call.

## 2014-12-17 NOTE — Telephone Encounter (Signed)
LMTCB about wellness form.Henderson Cloud/RD

## 2014-12-18 NOTE — Telephone Encounter (Signed)
Dr Hyacinth MeekerMiller, will you advise if I need to do anything prior to her appointment next week.//kn

## 2014-12-18 NOTE — Telephone Encounter (Signed)
Spoke with patient regarding [facility]. Patient understood and agreeable. Patient ready to schedule. Patient scheduled 12/24/14 @3pm  with Dr Hyacinth MeekerMiller. Patient requested i call back and leave detailed information on her voicemail. Called back and left arrival date/time, benefit information and 72 hour cancellation policy with $150 fee. [facility] initial call [facility]. Patient does not wish to schedule nurse visit to have form completed. Prefers to bring with her and discuss with Dr Hyacinth [facility]Miller [facility].  Patient also asks if she could have her thyroid lab [facility]. States she discussed need for thyroid labs with Dr Hyacinth MeekerMiller. Patient lives in [place] and would prefer all her needs taken care of during same appointment.   Routing to Dr Garen LahMillers nurse for review.

## 2014-12-18 NOTE — Telephone Encounter (Signed)
Nothing else is needed.  Thanks.  OK to close encounter.

## 2014-12-21 NOTE — Telephone Encounter (Signed)
Dr Hyacinth MeekerMiller, just FYI. Is there anything else that I need to do? Please advise.//kn

## 2014-12-21 NOTE — Telephone Encounter (Signed)
Patient canceled her appointment for ultrasound and states she will call back to reschedule.

## 2014-12-24 ENCOUNTER — Telehealth: Payer: Self-pay | Admitting: Obstetrics & Gynecology

## 2014-12-24 ENCOUNTER — Other Ambulatory Visit: Payer: 59 | Admitting: Obstetrics & Gynecology

## 2014-12-24 ENCOUNTER — Other Ambulatory Visit: Payer: 59

## 2014-12-24 NOTE — Telephone Encounter (Signed)
I will write a letter to her about this.  Ok to close encounter.

## 2014-12-24 NOTE — Telephone Encounter (Signed)
I have not seen this form.  Can she fax it?  She cancelled her ultrasound.  She needs to reschedule.  Had PMP bleeding.  Evaluation is prudent.

## 2014-12-24 NOTE — Telephone Encounter (Signed)
Spoke with patient. Patient states that she sent in a wellness form to be filled out by our office last week. "I was going to just use my summary form because it has all the information, but it does not have a signature."  States she was contacted by our office last week regarding this form. Is unsure who called her. Advised I will send a message to Dr.Miller and Tresa EndoKelly regarding form and return call with further information. Patient is agreeable.  Cc: Lorrene ReidKelly Nix, CMA

## 2014-12-24 NOTE — Telephone Encounter (Signed)
Patient calling in regards to wellness form that was given to our office and her needing us to fill out. Patient says she doesn't think she needs to come in for a office visit just to fill it out since she had her AEX 10/02/2014. Patient is checking on the status of this. Best # to reach patient: [phone]

## 2014-12-24 NOTE — Telephone Encounter (Signed)
Left message to call Kaitlyn at [phone]. 

## 2014-12-25 NOTE — Telephone Encounter (Signed)
Patient called back requesting the earlier date offered during previous call. She is now scheduled for 01/07/15 at 330pm with Dr Hyacinth MeekerMiller. To provider for notification prior to closing.

## 2014-12-25 NOTE — Telephone Encounter (Signed)
Spoke with patient. Patient states that she received a phone call last week stating that our office had her wellness form and we would hold it until she was seen in the office for her PUS. Patient states she was unable to keep her PUS appointment and needs the form completed. Obtained the form from the front. Advised will take the for to Dr.Miller for completion. Advised it is important that she reschedule her PUS appointment for evaluation of PMB. "I am not bleeding anymore." Advised even with no further bleeding it is important to complete this evaluation as any bleeding after menopause is abnormal. Patient is agreeable. States she will need to check her work schedule and return call to schedule PUS.

## 2014-12-25 NOTE — Telephone Encounter (Signed)
Patient called to reschedule sonohysterogram. Patient given next available date for scheduling. Patient requested 01/14/15 due to work scheduling. Patient scheduled for 01/14/15 @330pm  with Dr Hyacinth MeekerMiller. Patient states she will verify with work schedule and call if conflict. Reviewed 72 hour cancellation policy with $150 fee. Patient understood and agreeable. No further questions. Routing to provider for notification prior to closing.

## 2015-01-07 ENCOUNTER — Other Ambulatory Visit: Payer: Self-pay | Admitting: Obstetrics & Gynecology

## 2015-01-07 ENCOUNTER — Ambulatory Visit (INDEPENDENT_AMBULATORY_CARE_PROVIDER_SITE_OTHER): Payer: 59 | Admitting: Obstetrics & Gynecology

## 2015-01-07 ENCOUNTER — Ambulatory Visit (INDEPENDENT_AMBULATORY_CARE_PROVIDER_SITE_OTHER): Payer: 59

## 2015-01-07 VITALS — BP 110/64 | HR 70 | Resp 16 | Ht 64.5 in

## 2015-01-07 DIAGNOSIS — E038 Other specified hypothyroidism: Secondary | ICD-10-CM

## 2015-01-07 DIAGNOSIS — N950 Postmenopausal bleeding: Secondary | ICD-10-CM

## 2015-01-07 DIAGNOSIS — N938 Other specified abnormal uterine and vaginal bleeding: Secondary | ICD-10-CM | POA: Diagnosis not present

## 2015-01-07 NOTE — Progress Notes (Signed)
54 y.o. Deborah BradleyG2P2 Marriedfemale here for a pelvic ultrasound with sonohystogram due to abnormal bleeding.  Pt did skip cycles this summer and had an FSH drawn in September was value of 43.  After that time, she's continued to have bleeding that is like a cycle but isn't regular.  Denies any other vasomotor symptoms.  Denies pelvic pain.    Pt's TSH was elevated at last visit.  She has been taking 100mcg synthroid daily.  Will need RF if value is improved.  No LMP recorded.  Contraception: none  Technique:  Both transabdominal and transvaginal ultrasound examinations of the pelvis were performed. Transabdominal technique was performed for global imaging of the pelvis including uterus, ovaries, adnexal regions, and pelvic cul-de-sac.  It was necessary to proceed with endovaginal exam following the abdominal ultrasound transabdominal exam to visualize the endometrium and adnexa.  Color and duplex Doppler ultrasound was utilized to evaluate blood flow to the ovaries.   FINDINGS: Uterus: 9.0 x 4.6 x 3.8cm with small 6mm endocervical polyp (also noted on physical exam) Endometrium: 6.682mm Adnexa:  Left: 1.3 x 1.0 x 1.0cm     Right: 2.3 x 1.2 x 1.7cm Cul de sac: no masses/free fluid  SHSG:  After obtaining appropriate verbal consent from patient, the cervix was visualized using a speculum, and prepped with betadine.  A tenaculum  was applied to the cervix.  Dilation of the cervix was not necessary. The catheter was passed into the uterus and sterile saline introduced, with the following findings: no lesions  Endometrial biopsy recommended. Verbal and written consent obtained.  Speculum placed.  Cervix visualized and cleansed with betadine prep.  A single toothed tenaculum was applied to the anterior lip of the cervix.  Endometrial pipelle was advanced through the cervix into the endometrial cavity without difficulty.  Pipelle passed to 7.5cm.  Suction applied and pipelle removed with good tissue sample  obtained.  Tenculum removed.  No bleeding noted.  Patient tolerated procedure well.  All instruments removed.  Images reviewed with pt.  She knows I am a little anxious about her bleeding due to age so is comfortable with evaluation.  [facility] as [facility].  Assessment: Perimenopausal irregular bleeding Hypothyroidism Small endocervical polyp, do not feel needs removal due to size and normal Pap obtained 8/16.  Plan:  FSH and TSH pending.   Endometrial biopsy pending.  Results and additional recommendations will be called to pt.  ~25 minutes spent with patient >50% of time was in face to face discussion of above.

## 2015-01-08 ENCOUNTER — Encounter: Payer: Self-pay | Admitting: Obstetrics & Gynecology

## 2015-01-08 LAB — TSH: TSH: 0.106 u[IU]/mL — ABNORMAL LOW (ref 0.350–4.500)

## 2015-01-08 LAB — FOLLICLE STIMULATING HORMONE: FSH: 40 m[IU]/mL

## 2015-01-14 ENCOUNTER — Other Ambulatory Visit: Payer: 59 | Admitting: Obstetrics & Gynecology

## 2015-01-14 ENCOUNTER — Telehealth: Payer: Self-pay | Admitting: *Deleted

## 2015-01-14 ENCOUNTER — Other Ambulatory Visit: Payer: 59

## 2015-01-14 NOTE — Telephone Encounter (Signed)
Call to patient. Left message with husband to call back. No info given.

## 2015-01-14 NOTE — Telephone Encounter (Signed)
-----   Message from Jerene BearsMary S Miller, MD sent at 01/13/2015  7:44 AM EST ----- Please inform pt that her TSH is now a little too low.  She was on 75mcg but I had her increase to 100mcg.  I think 88mcg is probably where she needs to be.  Order placed for this.  Repeat TSH in 3 months.  FSH was 40.  She really shouldn't be bleeding like she is with this value.  Endometrial biopsy showed endometrial polyp and proliferative endometrium.  I think she should consider hysteroscopy and polyp resection, D&C.  We did not discuss this with her visit as I thought her FSH would be lower.  She may need a consult.  [place] in [place].  I can call her if that would be helpful.  Thanks.

## 2015-01-15 MED ORDER — LEVOTHYROXINE SODIUM 88 MCG PO TABS: 88.0000 ug | ORAL_TABLET | Freq: Every day | ORAL | Status: DC

## 2015-01-15 NOTE — Telephone Encounter (Signed)
Rx for Synthroid 88 mcg, 90 tabs with no refills, printed and to your desk for review and signature. Patient requests to be mailed.

## 2015-01-15 NOTE — Telephone Encounter (Signed)
Patient returned call. TSH results given as instructed. Patient requests written RX be mailed to her. States it is less expensive for her if she pays cash at [employer] and requests paper RX. Also advised of FSH and endometrial biopsy result as directed by Dr Hyacinth MeekerMiller. Discussed Dr Rondel BatonMiller's recommendation for Hysteroscopy/polyp resection/D&C. Brief description of procedure and benefits. Offered consult, advised she would have consult visit to discuss procedure with Dr Hyacinth MeekerMiller prior to actual procedure date. Patient considering options, requests call from business office with estimate to help her decide on December versus January date.

## 2015-01-15 NOTE — Telephone Encounter (Signed)
Spoke with patient regarding benefits for hysteroscopy/polyp resection/ D&C. Patient understood estimate as explained. Patient aware professional benefit only. Patient aware of $250 non refundable deposit and policy with scheduling. Patient would like to find out benefit from hospital prior to moving forward with scheduling. Provided patient with pre-service center contact information. Reassured patient she is not currently scheduled and we await her call to move forward. Patient agreeable. No further questions at this time. Patient will call with decision.  Routing to S.Raynelle JanYeakley for review.

## 2015-01-15 NOTE — Telephone Encounter (Signed)
Return call to patient. Left message to call back. Can also speak with triage nurse.  

## 2015-01-15 NOTE — Telephone Encounter (Signed)
Patient returning call.

## 2015-01-18 NOTE — Telephone Encounter (Signed)
Rx signed by Dr. Hyacinth MeekerMiller and mailed to address of record.  Encounter closed.

## 2015-02-19 ENCOUNTER — Telehealth: Payer: Self-pay | Admitting: *Deleted

## 2015-02-19 NOTE — Telephone Encounter (Signed)
Follow-up call to patient on status of surgery scheduling. Left message to call back.

## 2015-02-23 NOTE — Telephone Encounter (Signed)
Patient returns call. States she has not had any further bleeding since Nov 2016. At this point, declines to proceed with surgery. She is aware to call if any further bleeding occurs. FSH= 40 at appointment 01-07-2015. Advised Dr Hyacinth Meeker will review call and we will call her back if there are any further recommendations.

## 2015-03-12 NOTE — Telephone Encounter (Signed)
Please advise if any further follow-up for surgery is needed or ok to close encounter?

## 2015-03-17 NOTE — Telephone Encounter (Signed)
Reviewed with Dr Hyacinth Meeker, Patient has been counseled. Ok to close encounter.

## 2015-10-24 ENCOUNTER — Other Ambulatory Visit: Payer: Self-pay | Admitting: Obstetrics & Gynecology

## 2015-10-25 ENCOUNTER — Telehealth: Payer: Self-pay

## 2015-10-25 NOTE — Telephone Encounter (Signed)
Medication refill request: Levothyroxine Last AEX:  10/02/14 SM Next AEX: 01/21/16 SM Last MMG (if hormonal medication request): 10/02/14 [id number] Refill authorized:  01/15/15 #[id number] 0R. Please advise. Thank you.

## 2015-10-25 NOTE — Telephone Encounter (Signed)
PT had called and said she wants to be triaged for colonoscopy.  Has family hx of colon cancer ( maternal grandmother).   Last colonoscopy 04/06/2010 with Dr. Aleene DavidsonSpainhour.   I LMOM for a return call to triage.

## 2015-10-27 NOTE — Telephone Encounter (Signed)
Gastroenterology Pre-Procedure Review  Request Date: 10/25/2015 Requesting Physician:   PATIENT REVIEW QUESTIONS: The patient responded to the following health history questions as indicated:    1. Diabetes Melitis: no 2. Joint replacements in the past 12 months: no 3. Major health problems in the past 3 months: no 4. Has an artificial valve or MVP: no 5. Has a defibrillator: no 6. Has been advised in past to take antibiotics in advance of a procedure like teeth cleaning: no 7. Family history of colon cancer:YES   mathernal grandmother 288. Alcohol Use: Occasionally a glass of wine 9. History of sleep apnea: no     MEDICATIONS & ALLERGIES:    Patient reports the following regarding taking any blood thinners:   Plavix? no Aspirin? no Coumadin? no  Patient confirms/reports the following medications:  Current Outpatient Prescriptions  Medication Sig Dispense Refill  . levothyroxine (SYNTHROID, LEVOTHROID) 75 MCG tablet TAKE ONE TABLET BY MOUTH EVERY DAY BEFORE BREAKFAST 90 tablet 1  . Multiple Vitamins-Minerals (MULTIVITAMIN PO) Take by mouth.    . Calcium 600 MG tablet Take 600 mg by mouth.    . cholecalciferol (VITAMIN D) 1000 UNITS tablet Take 1,000 Units by mouth daily.    . phentermine 15 MG capsule Take 1 capsule (15 mg total) by mouth every morning. (Patient not taking: Reported on 10/27/2015) 30 capsule 1   No current facility-administered medications for this visit.     Patient confirms/reports the following allergies:  Allergies  Allergen Reactions  . Other     Hornet sting    No orders of the defined types were placed in this encounter.   AUTHORIZATION INFORMATION Primary Insurance:  ID #:   Group #:  Pre-Cert / Auth required:  Pre-Cert / Auth #:   Secondary Insurance:   ID #:   Group #:  Pre-Cert / Auth required:  Pre-Cert / Auth #:   SCHEDULE INFORMATION: Procedure has been scheduled as follows:  Date: 11/15/2015               Time:  9:30 AM Location:  [facility]  This Gastroenterology Pre-Precedure Review Form is being routed to the following provider(s): Jonette EvaSandi Fields, MD

## 2015-10-27 NOTE — Telephone Encounter (Signed)
SUPREP SPLIT DOSING- FULL LIQUIDS WITH BREAKFAST. DO NOT EAT ANYTHING WITH SOLID FOOD PARTICLES.   Full Liquid Diet A high-calorie, high-protein supplement should be used to meet your nutritional requirements when the full liquid diet is continued for more than 2 or 3 days. If this diet is to be used for an extended period of time (more than 7 days), a multivitamin should be considered.  Breads and Starches  Allowed: None are allowed    Avoid: Any others.    Potatoes/Pasta/Rice  Allowed: ANY ITEM AS A SOUP OR SMALL PLATE OF MASHED POTATOES OR SCRAMBLED EGGS.       Vegetables  Allowed: Strained tomato or vegetable juice. Vegetables pureed in soup.   Avoid: Any others.    Fruit  Allowed: Any strained fruit juices and fruit drinks. Include 1 serving of citrus or vitamin C-enriched fruit juice daily.   Avoid: Any others.  Meat and Meat Substitutes  Allowed: Egg  Avoid: Any meat, fish, or fowl. All cheese.  Milk  Allowed: SOY Milk beverages, including milk shakes and instant breakfast mixes. Smooth yogurt.   Avoid: Any others. Avoid dairy products if not tolerated.    Soups and Combination Foods  Allowed: Broth, strained cream soups. Strained, broth-based soups.   Avoid: Any others.    Desserts and Sweets  Allowed: flavored gelatin, tapioca, ice cream, sherbet, smooth pudding, junket, fruit ices, frozen ice pops, pudding pops, frozen fudge pops, chocolate syrup. Sugar, honey, jelly, syrup.   Avoid: Any others.  Fats and Oils  Allowed: Margarine, butter, cream, sour cream, oils.   Avoid: Any others.  Beverages  Allowed: All.   Avoid: None.  Condiments  Allowed: Iodized salt, pepper, spices, flavorings. Cocoa powder.   Avoid: Any others.    SAMPLE MEAL PLAN Breakfast   cup orange juice.   1 OR 2 EGGS  1 cup milk.   1 cup beverage (coffee or tea).   Cream or sugar, if desired.    Midmorning Snack  2 SCRAMBLED OR HARD BOILED  EGG   Lunch  1 cup cream soup.    cup fruit juice.   1 cup milk.    cup custard.   1 cup beverage (coffee or tea).   Cream or sugar, if desired.    Midafternoon Snack  1 cup milk shake.  Dinner  1 cup cream soup.    cup fruit juice.   1 cup MILK    cup pudding.   1 cup beverage (coffee or tea).   Cream or sugar, if desired.  Evening Snack  1 cup supplement.  To increase calories, add sugar, cream, butter, or margarine if possible. Nutritional supplements will also increase the total calories.

## 2015-11-02 ENCOUNTER — Other Ambulatory Visit: Payer: Self-pay

## 2015-11-02 DIAGNOSIS — Z800 Family history of malignant neoplasm of digestive organs: Secondary | ICD-10-CM

## 2015-11-02 DIAGNOSIS — Z1211 Encounter for screening for malignant neoplasm of colon: Secondary | ICD-10-CM

## 2015-11-02 MED ORDER — NA SULFATE-K SULFATE-MG SULF 17.5-3.13-1.6 GM/177ML PO SOLN: 1.0000 | ORAL | 0 refills | Status: DC

## 2015-11-02 NOTE — Telephone Encounter (Signed)
Rx sent to the pharmacy and instructions mailed to pt.  

## 2015-11-10 ENCOUNTER — Telehealth: Payer: Self-pay

## 2015-11-10 NOTE — Telephone Encounter (Signed)
I called [employer] @ [phone] and spoke to Covingtonhris R who said PA is required for the colonoscopy for family hx of colon cancer.   Pending PA [id number].

## 2015-11-15 ENCOUNTER — Encounter (HOSPITAL_COMMUNITY)
Admission: RE | Disposition: A | Discharge status: Home / Self Care | Payer: Self-pay | Source: Ambulatory Visit | Attending: Gastroenterology

## 2015-11-15 ENCOUNTER — Encounter (HOSPITAL_COMMUNITY): Payer: Self-pay | Admitting: *Deleted

## 2015-11-15 ENCOUNTER — Ambulatory Visit (HOSPITAL_COMMUNITY)
Admission: RE | Admit: 2015-11-15 | Discharge: 2015-11-15 | Disposition: A | Discharge status: Home / Self Care | Payer: 59 | Source: Ambulatory Visit | Attending: Gastroenterology | Admitting: Gastroenterology

## 2015-11-15 DIAGNOSIS — Z1211 Encounter for screening for malignant neoplasm of colon: Secondary | ICD-10-CM | POA: Insufficient documentation

## 2015-11-15 DIAGNOSIS — Q438 Other specified congenital malformations of intestine: Secondary | ICD-10-CM | POA: Insufficient documentation

## 2015-11-15 DIAGNOSIS — Z79899 Other long term (current) drug therapy: Secondary | ICD-10-CM | POA: Diagnosis not present

## 2015-11-15 DIAGNOSIS — K648 Other hemorrhoids: Secondary | ICD-10-CM | POA: Diagnosis not present

## 2015-11-15 DIAGNOSIS — Z800 Family history of malignant neoplasm of digestive organs: Secondary | ICD-10-CM | POA: Insufficient documentation

## 2015-11-15 DIAGNOSIS — E039 Hypothyroidism, unspecified: Secondary | ICD-10-CM | POA: Insufficient documentation

## 2015-11-15 DIAGNOSIS — K644 Residual hemorrhoidal skin tags: Secondary | ICD-10-CM | POA: Insufficient documentation

## 2015-11-15 HISTORY — PX: COLONOSCOPY: SHX5424

## 2015-11-15 SURGERY — COLONOSCOPY
Anesthesia: Moderate Sedation

## 2015-11-15 MED ORDER — MIDAZOLAM HCL 5 MG/5ML IJ SOLN
INTRAMUSCULAR | Status: DC | PRN
  Administered 2015-11-15: 1 mg via INTRAVENOUS
  Administered 2015-11-15 (×2): 2 mg via INTRAVENOUS

## 2015-11-15 MED ORDER — MEPERIDINE HCL 100 MG/ML IJ SOLN
INTRAMUSCULAR | Status: AC
  Filled 2015-11-15: qty 2

## 2015-11-15 MED ORDER — PROMETHAZINE HCL 25 MG/ML IJ SOLN
12.5000 mg | Freq: Once | INTRAMUSCULAR | Status: AC
  Administered 2015-11-15: 12.5 mg via INTRAVENOUS

## 2015-11-15 MED ORDER — SODIUM CHLORIDE 0.9 % IV SOLN
INTRAVENOUS | Status: DC
  Administered 2015-11-15: 09:00:00 via INTRAVENOUS

## 2015-11-15 MED ORDER — MIDAZOLAM HCL 5 MG/5ML IJ SOLN
INTRAMUSCULAR | Status: AC
  Filled 2015-11-15: qty 10

## 2015-11-15 MED ORDER — SODIUM CHLORIDE 0.9% FLUSH
INTRAVENOUS | Status: AC
  Filled 2015-11-15: qty 10

## 2015-11-15 MED ORDER — MEPERIDINE HCL 100 MG/ML IJ SOLN
INTRAMUSCULAR | Status: DC | PRN
  Administered 2015-11-15: 50 mg via INTRAVENOUS
  Administered 2015-11-15: 25 mg via INTRAVENOUS

## 2015-11-15 MED ORDER — PROMETHAZINE HCL 25 MG/ML IJ SOLN
INTRAMUSCULAR | Status: AC
  Filled 2015-11-15: qty 1

## 2015-11-15 NOTE — H&P (Signed)
  Primary Care Physician:  No primary care provider on file. Primary Gastroenterologist:  Dr. Oneida Alar  Pre-Procedure History & Physical: HPI:  Deborah Miranda is a 55 y.o. female here for [place].  Past Medical History:  Diagnosis Date  . Goiter   . Hypothyroid    Goiter    Past Surgical History:  Procedure Laterality Date  . FOOT SURGERY     pinkie toe    Prior to Admission medications   Medication Sig Start Date End Date Taking? Authorizing Provider  Calcium 600 MG tablet Take 600 mg by mouth daily.    Yes Historical Provider, MD  cholecalciferol (VITAMIN D) 1000 UNITS tablet Take 1,000 Units by mouth daily.   Yes Historical Provider, MD  levothyroxine (SYNTHROID, LEVOTHROID) 75 MCG tablet TAKE ONE TABLET BY MOUTH EVERY DAY BEFORE BREAKFAST 10/25/15  Yes Megan Salon, MD  Multiple Vitamins-Minerals (MULTIVITAMIN PO) Take 1 tablet by mouth daily.    Yes Historical Provider, MD  Na Sulfate-K Sulfate-Mg Sulf (SUPREP BOWEL PREP KIT) 17.5-3.13-1.6 GM/180ML SOLN Take 1 kit by mouth as directed. 11/02/15   Danie Binder, MD    Allergies as of 11/02/2015 - Review Complete 10/27/2015  Allergen Reaction Noted  . Other  10/02/2014    Family History  Problem Relation Age of Onset  . Hypertension Father   . Colon cancer Maternal Grandmother   . Cancer Maternal Grandmother     colon cancer  . Diabetes Paternal Grandmother     Social History   Social History  . Marital status: Married    Spouse name: N/A  . Number of children: N/A  . Years of education: N/A   Occupational History  . Not on file.   Social History Main Topics  . Smoking status: Never Smoker  . Smokeless tobacco: Never Used  . Alcohol use No  . Drug use: No  . Sexual activity: Yes    Partners: Male    Birth control/ protection: None   Other Topics Concern  . Not on file   Social History Narrative  . No narrative on file    Review of Systems: See HPI, otherwise negative  ROS   Physical Exam: There were no vitals taken for this visit. General:   Alert,  pleasant and cooperative in NAD Head:  Normocephalic and atraumatic. Neck:  Supple; Lungs:  Clear throughout to auscultation.    Heart:  Regular rate and rhythm. Abdomen:  Soft, nontender and nondistended. Normal bowel sounds, without guarding, and without rebound.   Neurologic:  Alert and  oriented x4;  grossly normal neurologically.  Impression/Plan:     SCREENING  Plan:  1. TCS TODAY

## 2015-11-15 NOTE — Discharge Instructions (Signed)
You have internal hemorrhoids. YOU DID NOT HAVE ANY POLYPS. ° ° °DRINK WATER TO KEEP YOUR URINE LIGHT YELLOW. ° °FOLLOW A HIGH FIBER DIET. AVOID ITEMS THAT CAUSE BLOATING. SEE INFO BELOW. ° °USE PREPARATION H FOUR TIMES  A DAY IF NEEDED TO RELIEVE RECTAL PAIN/PRESSURE/BLEEDING. ° °Next colonoscopy in 10 years. °Colonoscopy °Care After °Read the instructions outlined below and refer to this sheet in the next week. These discharge instructions provide you with general information on caring for yourself after you leave the hospital. While your treatment has been planned according to the most current medical practices available, unavoidable complications occasionally occur. If you have any problems or questions after discharge, call DR. Chace Klippel, [phone]. ° °ACTIVITY °· You may resume your regular activity, but move at a slower pace for the next 24 hours.  °· Take frequent rest periods for the next 24 hours.  °· Walking will help get rid of the air and reduce the bloated feeling in your belly (abdomen).  °· No driving for 24 hours (because of the medicine (anesthesia) used during the test).  °· You may shower.  °· Do not sign any important legal documents or operate any machinery for 24 hours (because of the anesthesia used during the test).  °·  °NUTRITION °· Drink plenty of fluids.  °· You may resume your normal diet as instructed by your doctor.  °· Begin with a light meal and progress to your normal diet. Heavy or fried foods are harder to digest and may make you feel sick to your stomach (nauseated).  °· Avoid alcoholic beverages for 24 hours or as instructed.  °·  °MEDICATIONS °· You may resume your normal medications. °·  °WHAT YOU CAN EXPECT TODAY °· Some feelings of bloating in the abdomen.  °· Passage of more gas than usual.  °· Spotting of blood in your stool or on the toilet paper °· .  °IF YOU HAD POLYPS REMOVED DURING THE COLONOSCOPY: °· Eat a soft diet IF YOU HAVE NAUSEA, BLOATING, ABDOMINAL PAIN, OR  VOMITING. °·   °FINDING OUT THE RESULTS OF YOUR TEST °Not all test results are available during your visit. DR. Vail Vuncannon WILL CALL YOU WITHIN 7 DAYS OF YOUR PROCEDUE WITH YOUR RESULTS. Do not assume everything is normal if you have not heard from DR. Gaetan Spieker IN ONE WEEK, CALL HER OFFICE AT [phone]. ° °SEEK IMMEDIATE MEDICAL ATTENTION AND CALL THE OFFICE: [phone] IF: °· You have more than a spotting of blood in your stool.  °· Your belly is swollen (abdominal distention).  °· You are nauseated or vomiting.  °· You have a temperature over 101F.  °· You have abdominal pain or discomfort that is severe or gets worse throughout the day. ° °High-Fiber Diet °A high-fiber diet changes your normal diet to include more whole grains, legumes, fruits, and vegetables. Changes in the diet involve replacing refined carbohydrates with unrefined foods. The calorie level of the diet is essentially unchanged. The Dietary Reference Intake (recommended amount) for adult males is 38 grams per day. For adult females, it is 25 grams per day. Pregnant and lactating women should consume 28 grams of fiber per day. °Fiber is the intact part of a plant that is not broken down during digestion. Functional fiber is fiber that has been isolated from the plant to provide a beneficial effect in the body. °PURPOSE °· Increase stool bulk.  °· Ease and regulate bowel movements.  °· Lower cholesterol.  °· REDUCE RISK OF COLON   CANCER ° °INDICATIONS THAT YOU NEED MORE FIBER °· Constipation and hemorrhoids.  °· Uncomplicated diverticulosis (intestine condition) and irritable bowel syndrome.  °· Weight management.  °· As a protective measure against hardening of the arteries (atherosclerosis), diabetes, and cancer.  ° °GUIDELINES FOR INCREASING FIBER IN THE DIET °· Start adding fiber to the diet slowly. A gradual increase of about 5 more grams (2 slices of whole-wheat bread, 2 servings of most fruits or vegetables, or 1 bowl of high-fiber cereal) per  day is best. Too rapid an increase in fiber may result in constipation, flatulence, and bloating.  °· Drink enough water and fluids to keep your urine clear or pale yellow. Water, juice, or caffeine-free drinks are recommended. Not drinking enough fluid may cause constipation.  °· Eat a variety of high-fiber foods rather than one type of fiber.  °· Try to increase your intake of fiber through using high-fiber foods rather than fiber pills or supplements that contain small amounts of fiber.  °· The goal is to change the types of food eaten. Do not supplement your present diet with high-fiber foods, but replace foods in your present diet.  ° °INCLUDE A VARIETY OF FIBER SOURCES °· Replace refined and processed grains with whole grains, canned fruits with fresh fruits, and incorporate other fiber sources. White rice, white breads, and most bakery goods contain little or no fiber.  °· Brown whole-grain rice, buckwheat oats, and many fruits and vegetables are all good sources of fiber. These include: broccoli, Brussels sprouts, cabbage, cauliflower, beets, sweet potatoes, white potatoes (skin on), carrots, tomatoes, eggplant, squash, berries, fresh fruits, and dried fruits.  °· Cereals appear to be the richest source of fiber. Cereal fiber is found in whole grains and bran. Bran is the fiber-rich outer coat of cereal grain, which is largely removed in refining. In whole-grain cereals, the bran remains. In breakfast cereals, the largest amount of fiber is found in those with "bran" in their names. The fiber content is sometimes indicated on the label.  °· You may need to include additional fruits and vegetables each day.  °· In baking, for 1 cup white flour, you may use the following substitutions:  °· 1 cup whole-wheat flour minus 2 tablespoons.  °· 1/2 cup white flour plus 1/2 cup whole-wheat flour.  ° °Hemorrhoids °Hemorrhoids are dilated (enlarged) veins around the rectum. Sometimes clots will form in the veins. This  makes them swollen and painful. These are called thrombosed hemorrhoids. °Causes of hemorrhoids include: °· Constipation.  °· Straining to have a bowel movement. °·  HEAVY LIFTING ° °HOME CARE INSTRUCTIONS °· Eat a well balanced diet and drink 6 to 8 glasses of water every day to avoid constipation. You may also use a bulk laxative.  °· Avoid straining to have bowel movements.  °· Keep anal area dry and clean.  °· Do not use a donut shaped pillow or sit on the toilet for long periods. This increases blood pooling and pain.  °· Move your bowels when your body has the urge; this will require less straining and will decrease pain and pressure.  ° °

## 2015-11-15 NOTE — Op Note (Signed)
[facility] Patient Name: Deborah Miranda Procedure Date: 11/15/2015 9:26 AM MRN: [id number] Date of Birth: [birth year] Attending MD: Jonette EvaSandi Keeli Roberg , MD CSN: [id number] Age: [age] Admit Type: Outpatient Procedure:                Colonoscopy, SCREENING Indications:              Screening for colon cancer: Family history of                            colorectal cancer in distant relative(s) 60 or                            older-GRANDMOTHER Providers:                Jonette EvaSandi Leiah Giannotti, MD, Nena PolioLisa Moore, RN, Lollie Marrowaylor [place],                            Pensions consultantTechnician Referring MD:             Leda QuailSuzanne Miller, MD Medicines:                Promethazine 12.5 mg IV, Meperidine 75 mg IV,                            Midazolam 5 mg IV Complications:            No immediate complications. Estimated Blood Loss:     Estimated blood loss: none. Procedure:                Pre-Anesthesia Assessment:                           - Prior to the procedure, a History and Physical                            was performed, and patient medications and                            allergies were reviewed. The patient's tolerance of                            previous anesthesia was also reviewed. The risks                            and benefits of the procedure and the sedation                            options and risks were discussed with the patient.                            All questions were answered, and informed consent                            was obtained. Prior Anticoagulants: The patient has  taken no previous anticoagulant or antiplatelet                            agents. ASA Grade Assessment: I - A normal, healthy                            patient. After reviewing the risks and benefits,                            the patient was deemed in satisfactory condition to                            undergo the procedure. After obtaining informed   consent, the colonoscope was passed under direct                            vision. Throughout the procedure, the patient's                            blood pressure, pulse, and oxygen saturations were                            monitored continuously. The [id number] [id number])                            scope was introduced through the anus and advanced                            to the the cecum, identified by appendiceal orifice                            and ileocecal valve. The colonoscopy was performed                            without difficulty. The patient tolerated the                            procedure well. The quality of the bowel                            preparation was excellent. The ileocecal valve,                            appendiceal orifice, and rectum were photographed. Scope In: 9:34:50 AM Scope Out: 9:48:54 AM Scope Withdrawal Time: 0 hours 10 minutes 40 seconds  Total Procedure Duration: 0 hours 14 minutes 4 seconds  Findings:      The digital rectal exam findings include non-thrombosed external       hemorrhoids.      The recto-sigmoid colon was mildly redundant. AND COLOWRAP WAS USED      Non-bleeding internal hemorrhoids were found. The hemorrhoids were small.      Non-bleeding external hemorrhoids were found. The hemorrhoids were       moderate. Impression:               -  Non-thrombosed external hemorrhoids                           - SLIGHTLY Redundant LEFT colon.                           - Non-bleeding internal hemorrhoids. Moderate Sedation:      Moderate (conscious) sedation was administered by the endoscopy nurse       and supervised by the endoscopist. The following parameters were       monitored: oxygen saturation, heart rate, blood pressure, and response       to care. Total physician intraservice time was 23 minutes. Recommendation:           - High fiber diet.                           - Continue present medications.                            - Repeat colonoscopy in 10 years for surveillance.                           - Patient has a contact number available for                            emergencies. The signs and symptoms of potential                            delayed complications were discussed with the                            patient. Return to normal activities tomorrow.                            Written discharge instructions were provided to the                            patient. Procedure Code(s):        --- Professional ---                           [phone], Colonoscopy, flexible; diagnostic, including                            collection of specimen(s) by brushing or washing,                            when performed (separate procedure)                           99152, Moderate sedation services provided by the                            same physician or other qualified health care  professional performing the diagnostic or                            therapeutic service that the sedation supports,                            requiring the presence of an independent trained                            observer to assist in the monitoring of the                            patient's level of consciousness and physiological                            status; initial 15 minutes of intraservice time,                            patient age 35 years or older                           [phone], Moderate sedation services; each additional                            15 minutes intraservice time Diagnosis Code(s):        --- Professional ---                           Z12.11, Encounter for screening for malignant                            neoplasm of colon                           Z80.0, Family history of malignant neoplasm of                            digestive organs                           K64.4, Residual hemorrhoidal skin tags                           K64.8, Other hemorrhoids                            Q43.8, Other specified congenital malformations of                            intestine CPT copyright 2016 American Medical Association. All rights reserved. The codes documented in this report are preliminary and upon coder review may  be revised to meet current compliance requirements. Jonette Eva, MD Jonette Eva, MD 11/15/2015 10:12:26 AM This report has been signed electronically. Number of Addenda: 0

## 2015-11-25 ENCOUNTER — Encounter (HOSPITAL_COMMUNITY): Payer: Self-pay | Admitting: Gastroenterology

## 2015-12-09 ENCOUNTER — Ambulatory Visit (INDEPENDENT_AMBULATORY_CARE_PROVIDER_SITE_OTHER): Payer: 59 | Admitting: Obstetrics & Gynecology

## 2015-12-09 ENCOUNTER — Encounter: Payer: Self-pay | Admitting: Obstetrics & Gynecology

## 2015-12-09 VITALS — BP 124/82 | HR 66 | Resp 14 | Ht 63.75 in | Wt 167.8 lb

## 2015-12-09 DIAGNOSIS — Z124 Encounter for screening for malignant neoplasm of cervix: Secondary | ICD-10-CM | POA: Diagnosis not present

## 2015-12-09 DIAGNOSIS — Z01419 Encounter for gynecological examination (general) (routine) without abnormal findings: Secondary | ICD-10-CM | POA: Diagnosis not present

## 2015-12-09 DIAGNOSIS — Z0000 Encounter for general adult medical examination without abnormal findings: Secondary | ICD-10-CM | POA: Diagnosis not present

## 2015-12-09 DIAGNOSIS — Z205 Contact with and (suspected) exposure to viral hepatitis: Secondary | ICD-10-CM | POA: Diagnosis not present

## 2015-12-09 DIAGNOSIS — N914 Secondary oligomenorrhea: Secondary | ICD-10-CM

## 2015-12-09 LAB — TSH: TSH: 4.09 mIU/L

## 2015-12-09 LAB — COMPREHENSIVE METABOLIC PANEL
ALK PHOS: 45 U/L (ref 33–130)
ALT: 25 U/L (ref 6–29)
AST: 22 U/L (ref 10–35)
Albumin: 4.4 g/dL (ref 3.6–5.1)
BILIRUBIN TOTAL: 1.2 mg/dL (ref 0.2–1.2)
BUN: 12 mg/dL (ref 7–25)
CALCIUM: 9.5 mg/dL (ref 8.6–10.4)
CO2: 26 mmol/L (ref 20–31)
Chloride: 101 mmol/L (ref 98–110)
Creat: 0.79 mg/dL (ref 0.50–1.05)
GLUCOSE: 96 mg/dL (ref 65–99)
POTASSIUM: 4.3 mmol/L (ref 3.5–5.3)
Sodium: 138 mmol/L (ref 135–146)
TOTAL PROTEIN: 7.3 g/dL (ref 6.1–8.1)

## 2015-12-09 LAB — HEMOGLOBIN, FINGERSTICK: HEMOGLOBIN, FINGERSTICK: 13.6 g/dL (ref 12.0–16.0)

## 2015-12-09 LAB — CBC
HCT: 44 % (ref 35.0–45.0)
HEMOGLOBIN: 14.6 g/dL (ref 11.7–15.5)
MCH: 28.6 pg (ref 27.0–33.0)
MCHC: 33.2 g/dL (ref 32.0–36.0)
MCV: 86.1 fL (ref 80.0–100.0)
MPV: 10.6 fL (ref 7.5–12.5)
PLATELETS: 213 10*3/uL (ref 140–400)
RBC: 5.11 MIL/uL — ABNORMAL HIGH (ref 3.80–5.10)
RDW: 14 % (ref 11.0–15.0)
WBC: 6.3 10*3/uL (ref 3.8–10.8)

## 2015-12-09 LAB — LIPID PANEL
CHOLESTEROL: 255 mg/dL — AB (ref 125–200)
HDL: 68 mg/dL (ref >=46)
LDL CALC: 163 mg/dL — AB (ref <130)
TRIGLYCERIDES: 120 mg/dL (ref <150)
Total CHOL/HDL Ratio: 3.8 Ratio (ref <=5.0)
VLDL: 24 mg/dL (ref <30)

## 2015-12-09 LAB — POCT URINALYSIS DIPSTICK
Bilirubin, UA: NEGATIVE
Blood, UA: NEGATIVE
GLUCOSE UA: NEGATIVE
Ketones, UA: NEGATIVE
LEUKOCYTES UA: NEGATIVE
NITRITE UA: NEGATIVE
PROTEIN UA: NEGATIVE
UROBILINOGEN UA: NEGATIVE
pH, UA: 5

## 2015-12-09 NOTE — Progress Notes (Signed)
55 y.o. [id number] MarriedCaucasianF here for annual exam.  This year has skipped several months of cycles.  Last cycles was in September.  Flow isn't heavy and lasts 4-5 days.  No clotting.   Pt has been taking synthroid but was on .  Had RF for so has been taking this dosage.  Needs TSH today.  Is considering seeing an endocrinologist.  Have discussed this in the past.  She will let me know if desires this.  Having some left hip and left low back low issus.  Has seen ortho.  Cannot exercise like she wants.  Has gained some weight which doesn't help either.    PCP:  Goes to local urgent care  Patient's last menstrual period was 11/03/2015.          Sexually active: Yes.    The current method of family planning is none.    Exercising: No.  The patient does not participate in regular exercise at present. Smoker:  no  Health Maintenance: Pap:  10/02/14 negative  History of abnormal Pap:  no MMG:  12/08/15 at [facility]  Colonoscopy:  11/22/15, Dr Darrick Penna.  Follow up 10 years. BMD:   never TDaP:  Up to date  Pneumonia vaccine(s):  never Zostavax:   never Hep C testing: drawn today Screening Labs: fasting labs drawn, Hb today: same, Urine today: normal    reports that she has never smoked. She has never used smokeless tobacco. She reports that she drinks alcohol. She reports that she does not use drugs.  Past Medical History:  Diagnosis Date  . Goiter   . Hypothyroid    Goiter    Past Surgical History:  Procedure Laterality Date  . COLONOSCOPY N/A 11/15/2015   Procedure: COLONOSCOPY;  Surgeon: West Bali, MD;  Location: [facility];  Service: Endoscopy;  Laterality: N/A;  9:30 Am  . FOOT SURGERY     pinkie toe  . TONSILLECTOMY      Current Outpatient Prescriptions  Medication Sig Dispense Refill  . levothyroxine (SYNTHROID, LEVOTHROID) 75 MCG tablet TAKE ONE TABLET BY MOUTH EVERY DAY BEFORE BREAKFAST 90 tablet 1  . Calcium 600 MG tablet Take 600 mg by mouth  daily.     . cholecalciferol (VITAMIN D) 1000 UNITS tablet Take 1,000 Units by mouth daily.    . Multiple Vitamins-Minerals (MULTIVITAMIN PO) Take 1 tablet by mouth daily.      No current facility-administered medications for this visit.     Family History  Problem Relation Age of Onset  . Hypertension Father   . Colon cancer Maternal Grandmother   . Cancer Maternal Grandmother     colon cancer  . Diabetes Paternal Grandmother     ROS:  Pertinent items are noted in HPI.  Otherwise, a comprehensive ROS was negative.  Exam:   BP 124/82 (BP Location: Right Arm, Patient Position: Sitting, Cuff Size: Normal)   Pulse 66   Resp 14   Ht 5' 3.75" (1.619 m)   Wt 167 lb 12.8 oz (76.1 kg)   LMP 11/03/2015   BMI 29.03 kg/m   Weight change:#11   Height: 5' 3.75" (161.9 cm)  Ht Readings from Last 3 Encounters:  12/09/15 5' 3.75" (1.619 m)  11/15/15 5\' 4"  (1.626 m)  01/07/15 5' 4.5" (1.638 m)    General appearance: alert, cooperative and appears stated age Head: Normocephalic, without obvious abnormality, atraumatic Neck: no adenopathy, supple, symmetrical, trachea midline and thyroid normal to inspection and palpation Lungs:  clear to auscultation bilaterally Breasts: normal appearance, no masses or tenderness Heart: regular rate and rhythm Abdomen: soft, non-tender; bowel sounds normal; no masses,  no organomegaly Extremities: extremities normal, atraumatic, no cyanosis or edema Skin: Skin color, texture, turgor normal. No rashes or lesions Lymph nodes: Cervical, supraclavicular, and axillary nodes normal. No abnormal inguinal nodes palpated Neurologic: Grossly normal   Pelvic: External genitalia:  no lesions              Urethra:  normal appearing urethra with no masses, tenderness or lesions              Bartholins and Skenes: normal                 Vagina: normal appearing vagina with normal color and discharge, no lesions              Cervix: no lesions              Pap  taken: Yes.   Bimanual Exam:  Uterus:  normal size, contour, position, consistency, mobility, non-tender              Adnexa: normal adnexa and no mass, fullness, tenderness               Rectovaginal: Confirms               Anus:  normal sphincter tone, no lesions  Chaperone was present for exam.  A:     Well Woman with normal exam Hypothyroidism Frustrated with weight gain Perimenopausal bleeding Family hx of colon cancer in [place].  (having follow up in 10 years makes pt uncomfortable.)  P: Mammogram yearly Pap and HR HPV  TSH, CMP, CBC Lipids today FSH today Hep C antibody obtained today No levothyroxine RF today.  If TSH is on the high side, will increase to as she has only been taking Return annually or prn

## 2015-12-09 NOTE — Addendum Note (Signed)
Addended by: Arnold LongALDWELL, Eliud Polo T on: 12/09/2015 10:40 AM   Modules accepted: Orders

## 2015-12-09 NOTE — Patient Instructions (Signed)
[facility]mage NapBalan, [facility]

## 2015-12-10 LAB — FOLLICLE STIMULATING HORMONE: FSH: 10.5 m[IU]/mL

## 2015-12-10 LAB — HEPATITIS C ANTIBODY: HCV AB: NEGATIVE

## 2015-12-13 ENCOUNTER — Other Ambulatory Visit: Payer: Self-pay | Admitting: Obstetrics & Gynecology

## 2015-12-13 MED ORDER — LEVOTHYROXINE SODIUM 88 MCG PO TABS: 88.0000 ug | ORAL_TABLET | Freq: Every day | ORAL | 4 refills | Status: DC

## 2015-12-14 ENCOUNTER — Telehealth: Payer: Self-pay | Admitting: *Deleted

## 2015-12-14 DIAGNOSIS — E039 Hypothyroidism, unspecified: Secondary | ICD-10-CM

## 2015-12-14 LAB — IPS PAP TEST WITH HPV

## 2015-12-14 NOTE — Telephone Encounter (Signed)
-----   Message from Jerene BearsMary S Miller, MD sent at 12/13/2015  5:53 AM EST ----- [facility] pt [facility] yet so I expect her to still have some menstrual bleeding.  She has started skipping cycles this past year.  Her Hepatitis C testing was negative.   Her cholesterol is continuing to go up.  Her total was 255 and LDLs were now 163.  This is in the range that needs treatment.  She does not have a PCP.  She has gained some weight this past year.  Losing that would help as well.  Does she need me to refer her to a PCP?  [place] in [place] so if she does need a referral, is someone in [place] ok?  Her CMP and CBC were fine.  Her TSH is now 4.0.  This is not outside of the normal range but is higher.  She's been taking a lower synthroid dosage because she was out.  I want her to go back to the 88mcg dosage.  [facility] for her.

## 2015-12-14 NOTE — Telephone Encounter (Signed)
Patient returned call. Patient notified of all lab results and patient verbalized understanding. Patient declines referral to PCP at this time because she states she is "not going on any medication" and knows that she wants to "try exercising and getting this weight off first." Patient did however ask for the name of the Endocrinologist Dr. Hyacinth MeekerMiller recommended. RN advised this message would be sent to Dr. Hyacinth MeekerMiller for review. Patient agreeable. Patient requested lab results be mailed to her. Verbal [facility] of records [facility].   Routing to provider for review.

## 2015-12-14 NOTE — Telephone Encounter (Signed)
I talked with her about Dr. Talmage NapBalan but she will need a referral and she is aware if desires to see her, she needs to let me know so we can do the referral for her.  Thanks.

## 2015-12-14 NOTE — Telephone Encounter (Signed)
Left message with husband, okay per DPR, for patient to call Irving Burtonmily back to review lab results.

## 2015-12-17 NOTE — Telephone Encounter (Signed)
Spoke with husband, Deborah Miranda, okay per DPR. States Deborah Miranda is at work and he will relay the message about referral and have her call the office.

## 2015-12-21 ENCOUNTER — Encounter: Payer: Self-pay | Admitting: Obstetrics & Gynecology

## 2016-01-03 NOTE — Telephone Encounter (Signed)
Referral has been placed.  Kriste BasqueBecky will work on this and communicate with pt regarding appt.  Thanks.

## 2016-01-03 NOTE — Telephone Encounter (Signed)
Patient returned call. Patient states she would like to be referred to Dr. Talmage NapBalan. RN advised this message would be sent to Dr. Hyacinth MeekerMiller so that the referral can be placed. Patient verbalized understanding.    Routing to provider for review.   Cc Braxton Feathersebecca Frahm

## 2016-01-21 ENCOUNTER — Ambulatory Visit: Payer: 59 | Admitting: Obstetrics & Gynecology

## 2016-01-24 ENCOUNTER — Telehealth: Payer: Self-pay | Admitting: Obstetrics & Gynecology

## 2016-01-24 NOTE — Telephone Encounter (Signed)
Left voicemail regarding referral appointment. Requested patient call for information and ask for [place] or [place]zy.

## 2016-09-27 ENCOUNTER — Telehealth: Payer: Self-pay | Admitting: Obstetrics & Gynecology

## 2016-09-27 NOTE — Telephone Encounter (Signed)
Patient labs mailed as requested.   Routing to provider for final review. Will close encounter.

## 2016-09-27 NOTE — Telephone Encounter (Signed)
Patient requests lab results from 12/2015 and 01/2015 be mailed to her. Please advise.

## 2017-01-15 ENCOUNTER — Other Ambulatory Visit: Payer: Self-pay | Admitting: Obstetrics & Gynecology

## 2017-01-16 NOTE — Telephone Encounter (Signed)
Medication refill request: synthroid  Last AEX:  12/09/15 SM Next AEX: 03/30/17  Last MMG (if hormonal medication request): 12/08/15 [id number]:neg  Refill authorized: 12/13/15 #[id number]. Today please advise.

## 2017-03-30 ENCOUNTER — Ambulatory Visit: Payer: 59 | Admitting: Obstetrics & Gynecology

## 2017-04-20 ENCOUNTER — Other Ambulatory Visit: Payer: Self-pay | Admitting: Obstetrics & Gynecology

## 2017-04-20 MED ORDER — LEVOTHYROXINE SODIUM 88 MCG PO TABS: ORAL_TABLET | ORAL | 0 refills | Status: DC

## 2017-04-20 NOTE — Telephone Encounter (Signed)
Patient scheduled for 11/06/17 with dr Hyacinth MeekerMiller.

## 2017-04-20 NOTE — Telephone Encounter (Signed)
Call to patient. Advised patient that prescription request had been sent to Dr. Hyacinth MeekerMiller for review and that Dr. Hyacinth MeekerMiller has been seeing patients this afternoon. Patient verbalized understanding.

## 2017-04-20 NOTE — Telephone Encounter (Signed)
RF has been completed but she needs a yearly exam.  Last was 11/17.  Please move up appt.

## 2017-04-20 NOTE — Telephone Encounter (Addendum)
Patient is requesting a refill for synthroid to be [facility] [place]. Patient is not scheduled for an annual. Patient states she has a couple of days left of medication Offered appointment but patient declined only wants to see Dr.Miller.

## 2017-04-20 NOTE — Telephone Encounter (Signed)
Medication refill request: Synthroid  Last AEX:  12/09/15 SM  Next AEX: 11/06/17  Last MMG (if hormonal medication request): 12/08/15 BIRADS 1 negative  Refill authorized: 01/17/17 #90, 0RF. Today, please advise.

## 2017-04-20 NOTE — Telephone Encounter (Signed)
Patient called to check on the status of the request. °

## 2017-04-23 NOTE — Telephone Encounter (Signed)
Call to patient. Advised refill had been sent in for patient on Friday. Patient agreeable to move aex up. Patient requests early morning appointment due to work schedule and traveling from [place]. AEX rescheduled to Tuesday 06/12/17 at 0830. Patient agreeable to date and time of appointment.   Patient agreeable to disposition. Will close encounter.

## 2017-06-12 ENCOUNTER — Encounter: Payer: Self-pay | Admitting: Obstetrics & Gynecology

## 2017-06-12 ENCOUNTER — Other Ambulatory Visit: Payer: Self-pay

## 2017-06-12 ENCOUNTER — Ambulatory Visit (INDEPENDENT_AMBULATORY_CARE_PROVIDER_SITE_OTHER): Payer: BLUE CROSS/BLUE SHIELD | Admitting: Obstetrics & Gynecology

## 2017-06-12 VITALS — BP 126/88 | HR 76 | Resp 16 | Ht 63.75 in | Wt 162.0 lb

## 2017-06-12 DIAGNOSIS — Z01419 Encounter for gynecological examination (general) (routine) without abnormal findings: Secondary | ICD-10-CM

## 2017-06-12 DIAGNOSIS — Z0000 Encounter for general adult medical examination without abnormal findings: Secondary | ICD-10-CM | POA: Diagnosis not present

## 2017-06-12 DIAGNOSIS — Z1231 Encounter for screening mammogram for malignant neoplasm of breast: Secondary | ICD-10-CM | POA: Diagnosis not present

## 2017-06-12 DIAGNOSIS — L918 Other hypertrophic disorders of the skin: Secondary | ICD-10-CM | POA: Diagnosis not present

## 2017-06-12 DIAGNOSIS — Z23 Encounter for immunization: Secondary | ICD-10-CM

## 2017-06-12 NOTE — Progress Notes (Signed)
57 y.o. [id number] MarriedCaucasianF here for annual exam.  Having a lot of stressors.  Husband had triple bypass in November.  He's just not doing that well.  He was retired.  They are eating much better.   Had no vaginal bleeding for almost a year but then had cycle in February.  Was a "normal cycle".  Had [facility] last year that was 10.    Patient's last menstrual period was 03/09/2017 (approximate).          Sexually active: Yes.    The current method of family planning is none.    Exercising: Yes.    walking, elliptical, pilates, yoga Smoker:  no  Health Maintenance: Pap:  12/09/15 Neg. HR HPV:neg   10/02/14 Neg  History of abnormal Pap:  no MMG:  12/08/15 BIRADS1:Neg. Had one this morning  Colonoscopy:  11/15/15 Normal. F/u 10 years  BMD:   Never  TDaP:  Done by PCP Pneumonia vaccine(s):  n/a Shingrix:   No Hep C testing: 12/09/15 neg  Screening Labs: fasting labs    reports that she has never smoked. She has never used smokeless tobacco. She reports that she drinks alcohol. She reports that she does not use drugs.  Past Medical History:  Diagnosis Date  . Goiter   . Hypothyroid    Goiter    Past Surgical History:  Procedure Laterality Date  . COLONOSCOPY N/A 11/15/2015   Procedure: COLONOSCOPY;  Surgeon: West Bali, MD;  [facility];  Service: Endoscopy;  Laterality: N/A;  9:30 Am  . FOOT SURGERY     pinkie toe  . TONSILLECTOMY      Current Outpatient Medications  Medication Sig Dispense Refill  . Calcium 600 MG tablet Take 600 mg by mouth daily.     . cholecalciferol (VITAMIN D) 1000 UNITS tablet Take 1,000 Units by mouth daily.    [name] levothyroxine (SYNTHROID, LEVOTHROID) 88 MCG tablet TAKE ONE TABLET BY MOUTH ONCE DAILY BEFORE BREAKFAST 90 tablet 0  . Multiple Vitamins-Minerals (MULTIVITAMIN PO) Take 1 tablet by mouth daily.      No current facility-administered medications for this visit.     Family History  Problem Relation Age of Onset  . Hypertension  Father   . Colon cancer Maternal Grandmother   . Cancer Maternal Grandmother        colon cancer  . Diabetes Paternal Grandmother     Review of Systems  All other systems reviewed and are negative.   Exam:   BP 126/88 (BP Location: Right Arm, Patient Position: Sitting, Cuff Size: Normal)   Pulse 76   Resp 16   Ht 5' 3.75" (1.619 m)   Wt 162 lb (73.5 kg)   LMP 03/09/2017 (Approximate)   BMI 28.03 kg/m   Height:   Height: 5' 3.75" (161.9 cm)  Ht Readings from Last 3 Encounters:  06/12/17 5' 3.75" (1.619 m)  12/09/15 5' 3.75" (1.619 m)  11/15/15  (1.626 m)    General appearance: alert, cooperative and appears stated age Head: Normocephalic, without obvious abnormality, atraumatic Neck: no adenopathy, supple, symmetrical, trachea midline and thyroid normal to inspection and palpation Lungs: clear to auscultation bilaterally Breasts: normal appearance, no masses or tenderness Heart: regular rate and rhythm Abdomen: soft, non-tender; bowel sounds normal; no masses,  no organomegaly Extremities: extremities normal, atraumatic, no cyanosis or edema Skin: Skin color, texture, turgor normal. No rashes or lesions Lymph nodes: Cervical, supraclavicular, and axillary nodes normal. No abnormal inguinal nodes palpated  Neurologic: Grossly normal   Pelvic: External genitalia:  Small skin tag on left labia majora (pt desires removal today)              Urethra:  normal appearing urethra with no masses, tenderness or lesions              Bartholins and Skenes: normal                 Vagina: normal appearing vagina with normal color and discharge, no lesions              Cervix: no lesions              Pap taken: No. Bimanual Exam:  Uterus:  normal size, contour, position, consistency, mobility, non-tender              Adnexa: normal adnexa and no mass, fullness, tenderness               Rectovaginal: Confirms               Anus:  normal sphincter tone, no lesions  Procedure:   Vulva cleansed with Betadine.  1% Lidocaine used for anesthetize skin.  1cc used.  Using sterile pick-ups and scissors, lesion fully excised.  Silver nitrate used for excellent hemostasis.  Pt tolerated procedure well.  No dressing applied.  Chaperone was present for exam.  A:  Well Woman with normal exam Perimenopausal, FSH 10 last year Hypothyroidism Family hx of colon cancer in [place] (47yr follow up recommended.  She is uncomfortable with year. D/w doing IFOB testing between year 5-10.) Hypothyroidism Mildly elevated cholestrol  P:   Mammogram yearly reviewed. pap smear and HR HPV 11/17.  Not indicated today. Skin tag not sent for pathology TSH and free t4, Lipids, HbA1C, CMP, Vit D obtained today Tdap updated today Will need to refill thyroid medication but will wait for lab results return annually or prn

## 2017-06-13 LAB — LIPID PANEL
Chol/HDL Ratio: 3.3 ratio (ref 0.0–4.4)
Cholesterol, Total: 194 mg/dL (ref 100–199)
HDL: 58 mg/dL (ref >39)
LDL CALC: 118 mg/dL — AB (ref 0–99)
TRIGLYCERIDES: 88 mg/dL (ref 0–149)
VLDL Cholesterol Cal: 18 mg/dL (ref 5–40)

## 2017-06-13 LAB — COMPREHENSIVE METABOLIC PANEL
A/G RATIO: 2 (ref 1.2–2.2)
ALT: 14 IU/L (ref 0–32)
AST: 18 IU/L (ref 0–40)
Albumin: 4.7 g/dL (ref 3.5–5.5)
Alkaline Phosphatase: 49 IU/L (ref 39–117)
BUN/Creatinine Ratio: 13 (ref 9–23)
BUN: 11 mg/dL (ref 6–24)
Bilirubin Total: 0.7 mg/dL (ref 0.0–1.2)
CO2: 25 mmol/L (ref 20–29)
CREATININE: 0.86 mg/dL (ref 0.57–1.00)
Calcium: 9.5 mg/dL (ref 8.7–10.2)
Chloride: 103 mmol/L (ref 96–106)
GFR, EST AFRICAN AMERICAN: 86 mL/min/{1.73_m2} (ref >59)
GFR, EST NON AFRICAN AMERICAN: 75 mL/min/{1.73_m2} (ref >59)
GLOBULIN, TOTAL: 2.3 g/dL (ref 1.5–4.5)
Glucose: 107 mg/dL — ABNORMAL HIGH (ref 65–99)
POTASSIUM: 4 mmol/L (ref 3.5–5.2)
SODIUM: 141 mmol/L (ref 134–144)
TOTAL PROTEIN: 7 g/dL (ref 6.0–8.5)

## 2017-06-13 LAB — HEMOGLOBIN A1C
Est. average glucose Bld gHb Est-mCnc: 131 mg/dL
HEMOGLOBIN A1C: 6.2 % — AB (ref 4.8–5.6)

## 2017-06-13 LAB — T4, FREE: FREE T4: 1.71 ng/dL (ref 0.82–1.77)

## 2017-06-13 LAB — TSH: TSH: 0.151 u[IU]/mL — AB (ref 0.450–4.500)

## 2017-06-13 LAB — VITAMIN D 25 HYDROXY (VIT D DEFICIENCY, FRACTURES): VIT D 25 HYDROXY: 41.5 ng/mL (ref 30.0–100.0)

## 2017-06-19 ENCOUNTER — Telehealth: Payer: Self-pay | Admitting: Obstetrics & Gynecology

## 2017-06-19 DIAGNOSIS — R7989 Other specified abnormal findings of blood chemistry: Secondary | ICD-10-CM

## 2017-06-19 MED ORDER — LEVOTHYROXINE SODIUM 75 MCG PO TABS: 75.0000 ug | ORAL_TABLET | ORAL | 0 refills | Status: DC

## 2017-06-19 NOTE — Telephone Encounter (Addendum)
Patient is requesting her lab results. I mailed a copy of her labs to her home per patient request.

## 2017-06-19 NOTE — Telephone Encounter (Signed)
-----   Message from Jerene Bears, MD sent at 06/19/2017  8:20 AM EDT ----- Please let pt know her Vit was normal.  Her CMP showed elevated glucose and HbA1C is 6.2.  This is in the prediabetes range now.  She does need a PCP to follow this going forward.  I don't think she has one.  Can I help with making her an appt?  Her TSH and free T4 look like she is getting a little too much thyroid medication.  Does she want me to adjust this or when she sees a new PCP?  She should alternate 88 and every other day so she will need a new rx for synthroid .  She needs to have this rechecked in 3 months.  TSH and free T4.    Her cholesterol was fine and her LDLs were just mildly elevated at 118 so this was better.  Ok to monitor.

## 2017-06-19 NOTE — Telephone Encounter (Signed)
Left message to call Marnette Perkins at [phone].  

## 2017-06-19 NOTE — Telephone Encounter (Signed)
Spoke with patient. Advised as seen below per Dr. Hyacinth Meeker. Patient does not have PCP and declined assistance scheduling with PCP. Patient request Dr. Hyacinth Meeker adjust thyroid meds.   New Rx for synthroid #45/0RF to verified pharmacy on file.   Patient declined to schedule f/u lab appointment, will return call to schedule. Future lab orders placed.   Routing to provider for final review. Patient is agreeable to disposition. Will close encounter.

## 2017-07-18 DIAGNOSIS — Z789 Other specified health status: Secondary | ICD-10-CM | POA: Diagnosis not present

## 2017-07-18 DIAGNOSIS — L298 Other pruritus: Secondary | ICD-10-CM | POA: Diagnosis not present

## 2017-07-18 DIAGNOSIS — L578 Other skin changes due to chronic exposure to nonionizing radiation: Secondary | ICD-10-CM | POA: Diagnosis not present

## 2017-07-18 DIAGNOSIS — L538 Other specified erythematous conditions: Secondary | ICD-10-CM | POA: Diagnosis not present

## 2017-07-18 DIAGNOSIS — L820 Inflamed seborrheic keratosis: Secondary | ICD-10-CM | POA: Diagnosis not present

## 2017-08-21 DIAGNOSIS — M542 Cervicalgia: Secondary | ICD-10-CM | POA: Diagnosis not present

## 2017-08-21 DIAGNOSIS — M9901 Segmental and somatic dysfunction of cervical region: Secondary | ICD-10-CM | POA: Diagnosis not present

## 2017-08-21 DIAGNOSIS — M9907 Segmental and somatic dysfunction of upper extremity: Secondary | ICD-10-CM | POA: Diagnosis not present

## 2017-08-28 ENCOUNTER — Encounter: Payer: Self-pay | Admitting: Obstetrics & Gynecology

## 2017-11-06 ENCOUNTER — Ambulatory Visit: Payer: Self-pay | Admitting: Obstetrics & Gynecology

## 2018-02-20 ENCOUNTER — Other Ambulatory Visit: Payer: Self-pay | Admitting: Obstetrics & Gynecology

## 2018-02-20 NOTE — Telephone Encounter (Signed)
Medication refill request: Synthroid 88 mcg Last AEX:  06/12/2017 Next AEX: 07/05/2018 Last MMG (if hormonal medication request): 06/12/2017 Birads 1 negative Refill authorized: Synthroid 88 mcg #90 0RF  Please refill if appropriate.

## 2018-02-25 ENCOUNTER — Telehealth: Payer: Self-pay | Admitting: Obstetrics & Gynecology

## 2018-02-25 DIAGNOSIS — E039 Hypothyroidism, unspecified: Secondary | ICD-10-CM

## 2018-02-25 NOTE — Telephone Encounter (Signed)
Spoke with patient. Requesting referral for endocrinology, Dr. Elvera Lennox, for hypothyroidism. Last seen 06/12/17, was advised to return in 82mo for repeat TSH & free T4. Patient has not had this rechecked, has not established care with PCP. Denies any new symptoms or concerns. Still taking synthroid and alternating days.   Advised I will review referral with Dr. Hyacinth Meeker when she returns to the office on 1/21 and will return call with recommendations. Patient agreeable.   Referral pended.   Dr. Hyacinth Meeker -ok to proceed with referral?

## 2018-02-25 NOTE — Telephone Encounter (Signed)
Yes, ok to proceed with referral.  Thanks.

## 2018-02-25 NOTE — Telephone Encounter (Signed)
Patient requesting a referral to Dr. Ernest Haber, Endocrinologist. She said she called them to schedule herself but was told she needed a referral with records to be sent first for review.

## 2018-02-26 NOTE — Telephone Encounter (Signed)
Referral placed to endocrinology.   Call returned to patient, left detailed message, ok per dpr. Advised referral placed to Dr. Elvera Lennox, her office will contact you directly to schedule. Return call to office if any additional questions.   Routing to [employer].  Encounter closed.

## 2018-03-25 ENCOUNTER — Encounter: Payer: Self-pay | Admitting: Internal Medicine

## 2018-03-25 ENCOUNTER — Ambulatory Visit: Payer: Self-pay | Admitting: Internal Medicine

## 2018-03-25 VITALS — BP 118/70 | HR 65 | Ht 63.75 in | Wt 171.0 lb

## 2018-03-25 DIAGNOSIS — Z8639 Personal history of other endocrine, nutritional and metabolic disease: Secondary | ICD-10-CM | POA: Diagnosis not present

## 2018-03-25 DIAGNOSIS — E039 Hypothyroidism, unspecified: Secondary | ICD-10-CM | POA: Diagnosis not present

## 2018-03-25 DIAGNOSIS — R7309 Other abnormal glucose: Secondary | ICD-10-CM

## 2018-03-25 LAB — HEMOGLOBIN A1C: Hgb A1c MFr Bld: 6.2 % (ref 4.6–6.5)

## 2018-03-25 NOTE — Progress Notes (Signed)
Patient ID: Deborah Miranda, female   DOB: 02/15/1960, 58 y.o.   MRN: [id number]    HPI  Deborah Miranda is a 58 y.o.-year-old female, referred by Dr. Valentina ShaggyMary Miller, for management of uncontrolled hypothyroidism.  Pt. has been dx with hypothyroidism in ~2000 >> on Levothyroxine 88 alternating with 75 mcg qod in 06/2017.  She takes the thyroid hormone: - fasting - with water - + Nespresso + milk frother - separated by >1h from b'fast  - no calcium, iron, PPIs - + occasional multivitamins lunch or dinner Takes a B12 vitamin. Takes a Hair Skin and Nails - last dose last week. Takes Mg, Ubiquinol, vitamin D, vitamin C -sporadically.  I reviewed pt's thyroid tests: Lab Results  Component Value Date   TSH 0.151 (L) 06/12/2017   TSH 4.09 12/09/2015   TSH 0.106 (L) 01/07/2015   TSH 8.661 (H) 10/02/2014   TSH 1.609 04/30/2014   TSH 2.889 09/26/2013   TSH 1.026 07/31/2012   FREET4 1.71 06/12/2017   Antithyroid antibodies: No results found for: THGAB No components found for: TPOAB  Pt describes: - weight gain - fatigue - Hot flashes - Poor sleep - Low libido  She also has a history of goiter palpated by her PCP in the past.  She had a thyroid ultrasound that did not show any pathology reportedly.  I do not have these results.  Pt denies feeling nodules in neck, hoarseness, dysphagia/odynophagia, SOB with lying down.  She has + FH of thyroid disorders in: mother - had RAI tx. No FH of thyroid cancer.  No h/o radiation tx to head or neck. No recent use of iodine supplements.  ROS: Constitutional: + See HPI Eyes: no blurry vision, no xerophthalmia ENT: no sore throat, + see HPI Cardiovascular: no CP/SOB/palpitations/leg swelling Respiratory: no cough/SOB Gastrointestinal: no N/V/D/C Musculoskeletal: no muscle/joint aches Skin: no rashes Neurological: no tremors/numbness/tingling/dizziness Psychiatric: no depression/anxiety + Low libido  Past Medical  History:  Diagnosis Date  . Goiter   . Hypothyroid    Goiter   Past Surgical History:  Procedure Laterality Date  . COLONOSCOPY N/A 11/15/2015   Procedure: COLONOSCOPY;  Surgeon: [name] L Fields, MD;  Location: [facility];  Service: Endoscopy;  Laterality: N/A;  9:30 Am  . FOOT SURGERY     pinkie toe  . TONSILLECTOMY     Social History   Socioeconomic History  . Marital status: Married    Spouse name: Not on file  . Number of children: 2  . Years of education: Not on file  . Highest education level: Not on file  Occupational History  .  Banker  Social Needs  . Financial resource strain: Not on file  . Food insecurity:    Worry: Not on file    Inability: Not on file  . Transportation needs:    Medical: Not on file    Non-medical: Not on file  Tobacco Use  . Smoking status: Never Smoker  . Smokeless tobacco: Never Used  Substance and Sexual Activity  . Alcohol use: Yes    Comment: occasional red wine occasionally, wondering  . Drug use: No  . Sexual activity: Yes    Partners: Male    Birth control/protection: None   Current Outpatient Medications on File Prior to Visit  Medication Sig Dispense Refill  . cholecalciferol (VITAMIN D) 1000 UNITS tablet Take 1,000 Units by mouth daily.    [name]. levothyroxine (SYNTHROID) 75 MCG tablet Take 1 tablet (75 mcg total) by mouth every  other day. Before breakfast. Alternate 75 mcg and 88 mcg q other day. 45 tablet 0  . levothyroxine (SYNTHROID, LEVOTHROID) 88 MCG tablet TAKE 1 TABLET BY MOUTH ONCE DAILY BEFORE BREAKFAST 90 tablet 0  . Multiple Vitamins-Minerals (MULTIVITAMIN PO) Take 1 tablet by mouth daily.      No current facility-administered medications on file prior to visit.    Allergies  Allergen Reactions  . Other     Hornet sting   Family History  Problem Relation Age of Onset  . Hypertension Father   . Colon cancer Maternal Grandmother   . Diabetes Paternal Grandmother     PE: BP 118/70   Pulse 65   Ht 5'  3.75" (1.619 m)   Wt 171 lb (77.6 kg)   SpO2 98%   BMI 29.58 kg/m  Wt Readings from Last 3 Encounters:  03/25/18 171 lb (77.6 kg)  06/12/17 162 lb (73.5 kg)  12/09/15 167 lb 12.8 oz (76.1 kg)   Constitutional: overweight, in NAD Eyes: PERRLA, EOMI, no exophthalmos ENT: moist mucous membranes, no thyromegaly, no cervical lymphadenopathy Cardiovascular: RRR, No MRG Respiratory: CTA B Gastrointestinal: abdomen soft, NT, ND, BS+ Musculoskeletal: no deformities, strength intact in all 4 Skin: moist, warm, no rashes Neurological: no tremor with outstretched hands, DTR normal in all 4  ASSESSMENT: 1. Hypothyroidism  2.  History of goiter  3.  Elevated HbA1c  PLAN:  1. Patient with long-standing hypothyroidism, on levothyroxine therapy.  TFTs have been fluctuating in the past, with TSH levels either above or below the normal range interspersed with normal levels.   - she appears euthyroid, but complains of fatigue, difficulty losing weight.  - We discussed about correct intake of levothyroxine, fasting, with water, separated by at least 30 minutes from breakfast, and separated by more than 4 hours from calcium, iron, multivitamins, acid reflux medications (PPIs).  She drinks coffee (espresso + milk) first thing in the morning and takes levothyroxine with it or around the same time.  We discussed about the need to separate the coffee by at least 30 minutes from levothyroxine.  She is otherwise taking the medication correctly. - will check thyroid tests today: TSH, free T4, free T3  - After results returned, we discussed about switching to a brand-name formulation of levothyroxine (coupon card given) to hopefully reduce the variability of her TFTs from now on. - If labs today are abnormal, she will need to return in ~6 weeks for repeat labs - Otherwise, I will see her back in 6 months  2.  History of goiter -Only by palpation in the past.  Thyroid ultrasound reportedly negative for  pathology. - I could not feel her thyroid today during physical exam - She does not have neck compression symptoms  3. High HbA1c - last 06/2017: 6.2%, I do not have other previous results - repeat HbA1c today - Discussed about improving diet by cutting down fatty foods like cheese.  Given references about plant-based diet and its role in preventing and reversing diabetes.  Needs refills - Brand name to [facility].  Component     Latest Ref Rng & Units 03/25/2018  TSH     0.35 - 4.50 uIU/mL 0.50  T4,Free(Direct)     0.60 - 1.60 ng/dL 2.08  Hemoglobin Y2M     4.6 - 6.5 % 6.2  Triiodothyronine,Free,Serum     2.3 - 4.2 pg/mL 3.5  Thyroid test are normal.  We will continue the same doses of levothyroxine to switch to  brand name. HbA1c stable, in the prediabetic range.  Discussed about change in diet.  We will repeat this at next visit.  Carlus Pavlov, MD PhD [facility] Endocrinology

## 2018-03-25 NOTE — Patient Instructions (Addendum)
Please continue: - Levothyroxine 88 mcg alternating with 75 mcg daily.  Take the thyroid hormone every day, with water, at least 30 minutes before breakfast, separated by at least 4 hours from: - acid reflux medications - calcium - iron - multivitamins  Move coffee >30 min after Levothyroxine.  Please stop at the lab.  When the results return, I will send in brand name Synthroid.  Please come back for a follow-up appointment in 6 months.

## 2018-03-26 ENCOUNTER — Telehealth: Payer: Self-pay

## 2018-03-26 LAB — T3, FREE: T3 FREE: 3.5 pg/mL (ref 2.3–4.2)

## 2018-03-26 LAB — T4, FREE: Free T4: 1.03 ng/dL (ref 0.60–1.60)

## 2018-03-26 LAB — TSH: TSH: 0.5 u[IU]/mL (ref 0.35–4.50)

## 2018-03-26 MED ORDER — SYNTHROID 88 MCG PO TABS: 88.0000 ug | ORAL_TABLET | ORAL | 3 refills | Status: DC

## 2018-03-26 MED ORDER — SYNTHROID 75 MCG PO TABS: 75.0000 ug | ORAL_TABLET | ORAL | 3 refills | Status: DC

## 2018-03-26 NOTE — Telephone Encounter (Signed)
-----   Message from Carlus Pavlov, MD sent at 03/26/2018  3:07 PM EST ----- Efraim Kaufmann, can you please call pt:  Thyroid test are now normal.  We will continue the same doses of levothyroxine but switch to brand name, as we discussed.  I sent the prescriptions to [facility]. HbA1c stable, in the prediabetic range.  Discussed about change in diet at the time of the visit.  We will repeat this at next visit.

## 2018-03-26 NOTE — Telephone Encounter (Signed)
Left message for patient to return our call at [phone].  

## 2018-03-27 ENCOUNTER — Encounter: Payer: Self-pay | Admitting: Internal Medicine

## 2018-03-27 NOTE — Telephone Encounter (Signed)
Duplicate encounter created in error.

## 2018-03-27 NOTE — Telephone Encounter (Signed)
Notified patient of message from Dr. Gherghe, patient expressed understanding and agreement. No further questions.  

## 2018-03-27 NOTE — Telephone Encounter (Signed)
Patient states she is returning Melissa's call re: lab results. Please call patient at ph# [phone] or [phone]

## 2018-03-27 NOTE — Telephone Encounter (Signed)
Left message for patient to return our call at [phone].  Advised patient on her VM to let front know to ask for me when she calls so we can avoid phone tag and duplicate encounters.

## 2018-04-11 ENCOUNTER — Other Ambulatory Visit: Payer: Self-pay

## 2018-04-11 MED ORDER — SYNTHROID 75 MCG PO TABS: 75.0000 ug | ORAL_TABLET | ORAL | 3 refills | Status: DC

## 2018-04-12 NOTE — Telephone Encounter (Signed)
Pt is having a very hard time with her insurance company paying for the 45 tabs of both the 88 and 75 mcg tablets. They do not see that this together is a 90 day supply and they are not paying. Pt is requesting for [place] to either call in the 88 or the 75 as a true 90 day supply it is just too much of a hassle, please come see me so I can further explain

## 2018-04-22 ENCOUNTER — Telehealth: Payer: Self-pay | Admitting: Internal Medicine

## 2018-04-22 NOTE — Telephone Encounter (Signed)
MEDICATION: SYNTHROID 88 MCG tablet  PHARMACY:   [facility] [address] [place] - [address] [phone] (Phone) [phone] (Fax)    IS THIS A 90 DAY SUPPLY : Yes  IS PATIENT OUT OF MEDICATION: -Almost  IF NOT; HOW MUCH IS LEFT:1 week  LAST APPOINTMENT DATE: @3 /06/2018  NEXT APPOINTMENT DATE:@8 /20/2020  DO WE HAVE YOUR PERMISSION TO LEAVE A DETAILED MESSAGE:Yes  OTHER COMMENTS:    **Let patient know to contact pharmacy at the end of the day to make sure medication is ready. **  ** Please notify patient to allow 48-72 hours to process**  **Encourage patient to contact the pharmacy for refills or they can request refills through [facility]**

## 2018-04-23 ENCOUNTER — Telehealth: Payer: Self-pay | Admitting: Internal Medicine

## 2018-04-23 MED ORDER — LEVOTHYROXINE SODIUM 75 MCG PO TABS: 75.0000 ug | ORAL_TABLET | Freq: Every day | ORAL | 3 refills | Status: DC

## 2018-04-23 MED ORDER — SYNTHROID 88 MCG PO TABS: 88.0000 ug | ORAL_TABLET | ORAL | 3 refills | Status: DC

## 2018-04-23 NOTE — Telephone Encounter (Signed)
Both have been sent. 

## 2018-04-23 NOTE — Telephone Encounter (Signed)
Patient is requesting a call back to discuss what [employer] is advising her to do with her prescription for the SYNTHROID medication.    Please advise

## 2018-04-23 NOTE — Telephone Encounter (Signed)
ok to change 1 of them to generic.

## 2018-04-23 NOTE — Telephone Encounter (Signed)
Patient states her insurance will only pay for a brand name 90 and a generic 90 day.  Patient would like to know if it would be ok to take the 88 as brand name and the 75 as generic?

## 2018-04-29 ENCOUNTER — Other Ambulatory Visit: Payer: Self-pay

## 2018-04-29 ENCOUNTER — Telehealth: Payer: Self-pay | Admitting: Internal Medicine

## 2018-04-29 MED ORDER — SYNTHROID 88 MCG PO TABS: ORAL_TABLET | ORAL | 3 refills | Status: DC

## 2018-04-29 NOTE — Telephone Encounter (Signed)
Left message for patient to return our call at [phone].  

## 2018-04-29 NOTE — Telephone Encounter (Signed)
Patient would like a call back to discuss a medication that she is having a hard time getting corrected,.

## 2018-04-29 NOTE — Telephone Encounter (Signed)
Spoke to patient and changed the 88 to 90 days.

## 2018-07-05 ENCOUNTER — Encounter: Payer: Self-pay | Admitting: Obstetrics & Gynecology

## 2018-07-05 ENCOUNTER — Other Ambulatory Visit: Payer: Self-pay

## 2018-07-05 ENCOUNTER — Ambulatory Visit: Payer: BLUE CROSS/BLUE SHIELD | Admitting: Obstetrics & Gynecology

## 2018-07-05 ENCOUNTER — Other Ambulatory Visit (HOSPITAL_COMMUNITY)
Admission: RE | Admit: 2018-07-05 | Discharge: 2018-07-05 | Disposition: A | Discharge status: Home / Self Care | Payer: BC Managed Care – PPO | Source: Ambulatory Visit | Attending: Obstetrics & Gynecology | Admitting: Obstetrics & Gynecology

## 2018-07-05 VITALS — BP 132/88 | HR 64 | Temp 97.6°F | Ht 63.75 in | Wt 171.0 lb

## 2018-07-05 DIAGNOSIS — Z1231 Encounter for screening mammogram for malignant neoplasm of breast: Secondary | ICD-10-CM | POA: Diagnosis not present

## 2018-07-05 DIAGNOSIS — N912 Amenorrhea, unspecified: Secondary | ICD-10-CM

## 2018-07-05 DIAGNOSIS — Z01419 Encounter for gynecological examination (general) (routine) without abnormal findings: Secondary | ICD-10-CM

## 2018-07-05 DIAGNOSIS — Z124 Encounter for screening for malignant neoplasm of cervix: Secondary | ICD-10-CM

## 2018-07-05 DIAGNOSIS — Z0000 Encounter for general adult medical examination without abnormal findings: Secondary | ICD-10-CM | POA: Diagnosis not present

## 2018-07-05 NOTE — Progress Notes (Signed)
58 y.o. G1P2002 Married Other or two or more races female here for annual exam.  Doing well.  Denies vaginal bleeding.  Last cycle was October.    Patient's last menstrual period was 11/06/2017 (approximate).          Sexually active: Yes.    The current method of family planning is none.    Exercising: No.   Smoker:  no  Health Maintenance: Pap:  12-09-15 neg HPV HR neg  10/02/14 Neg  History of abnormal Pap:  no MMG:  06/12/17 BIRADS1:neg. Had one this morning  Colonoscopy:  11/15/15 neg f/u 20yrs. BMD:   none TDaP:  2019 Pneumonia vaccine(s):  No Shingrix:   No.  Declines. Hep C testing: 12/09/15 neg  Screening Labs: Here today - fasting    reports that she has never smoked. She has never used smokeless tobacco. She reports previous alcohol use. She reports that she does not use drugs.  Past Medical History:  Diagnosis Date  . Goiter   . Hypothyroid    Goiter    Past Surgical History:  Procedure Laterality Date  . COLONOSCOPY N/A 11/15/2015   Procedure: COLONOSCOPY;  Surgeon: West Bali, MD;  [facility];  Service: Endoscopy;  Laterality: N/A;  9:30 Am  . FOOT SURGERY     pinkie toe  . TONSILLECTOMY      Current Outpatient Medications  Medication Sig Dispense Refill  . cholecalciferol (VITAMIN D) 1000 UNITS tablet Take 1,000 Units by mouth daily.    [name] levothyroxine (SYNTHROID, LEVOTHROID) 75 MCG tablet Take 1 tablet (75 mcg total) by mouth daily. 90 tablet 3  . SYNTHROID 88 MCG tablet Take 88 mcg daily 90 tablet 3   No current facility-administered medications for this visit.     Family History  Problem Relation Age of Onset  . Hypertension Father   . Colon cancer Maternal Grandmother   . Diabetes Paternal Grandmother     Review of Systems  All other systems reviewed and are negative.   Exam:   BP 140/90   Pulse 64   Temp 97.6 F (36.4 C) (Temporal)   Ht 5' 3.75" (1.619 m)   Wt 171 lb (77.6 kg)   LMP 11/06/2017 (Approximate)   BMI 29.58  kg/m     Height: 5' 3.75" (161.9 cm)  Ht Readings from Last 3 Encounters:  07/05/18 5' 3.75" (1.619 m)  03/25/18 5' 3.75" (1.619 m)  06/12/17 5' 3.75" (1.619 m)    General appearance: alert, cooperative and appears stated age Head: Normocephalic, without obvious abnormality, atraumatic Neck: no adenopathy, supple, symmetrical, trachea midline and thyroid normal to inspection and palpation Lungs: clear to auscultation bilaterally Breasts: normal appearance, no masses or tenderness Heart: regular rate and rhythm Abdomen: soft, non-tender; bowel sounds normal; no masses,  no organomegaly Extremities: extremities normal, atraumatic, no cyanosis or edema Skin: Skin color, texture, turgor normal. No rashes or lesions Lymph nodes: Cervical, supraclavicular, and axillary nodes normal. No abnormal inguinal nodes palpated Neurologic: Grossly normal   Pelvic: External genitalia:  no lesions              Urethra:  normal appearing urethra with no masses, tenderness or lesions               Bartholins and Skenes: normal                 Vagina: normal appearing vagina with normal color and discharge, no lesions  Cervix: no lesions              Pap taken: Yes.   Bimanual Exam:  Uterus:  normal size, contour, position, consistency, mobility, non-tender              Adnexa: normal adnexa and no mass, fullness, tenderness               Rectovaginal: Confirms               Anus:  normal sphincter tone, no lesions  Chaperone was present for exam.  A:  Well Woman with normal exam Perimenopausal Hypothyroidism, followed now by Dr. Milas GainGehrghe Family hx of colon cancer in [place] (10 year follow up recommended.  Pt desires to do every 5 years) Mildly elevated cholesterol BMI 29.5 Mildly elevated BP today  P:   Mammogram guidelines reviewed.   pap smear obtained FSH, Lipids, Vit D, lipids ordered Has BP cuff at home and is going to start watching blood pressure Weight management options  discussed Return annually or prn

## 2018-07-05 NOTE — Patient Instructions (Signed)
[facility] [address] [place], [place] [address] [phone]  Dr. Caren Beasley 

## 2018-07-06 LAB — COMPREHENSIVE METABOLIC PANEL
ALT: 25 IU/L (ref 0–32)
AST: 17 IU/L (ref 0–40)
Albumin/Globulin Ratio: 1.7 (ref 1.2–2.2)
Albumin: 4.5 g/dL (ref 3.8–4.9)
Alkaline Phosphatase: 56 IU/L (ref 39–117)
BUN/Creatinine Ratio: 17 (ref 9–23)
BUN: 14 mg/dL (ref 6–24)
Bilirubin Total: 0.9 mg/dL (ref 0.0–1.2)
CO2: 25 mmol/L (ref 20–29)
Calcium: 9.5 mg/dL (ref 8.7–10.2)
Chloride: 101 mmol/L (ref 96–106)
Creatinine, Ser: 0.84 mg/dL (ref 0.57–1.00)
GFR calc Af Amer: 88 mL/min/{1.73_m2} (ref >59)
GFR calc non Af Amer: 76 mL/min/{1.73_m2} (ref >59)
Globulin, Total: 2.7 g/dL (ref 1.5–4.5)
Glucose: 117 mg/dL — ABNORMAL HIGH (ref 65–99)
Potassium: 4.1 mmol/L (ref 3.5–5.2)
Sodium: 139 mmol/L (ref 134–144)
Total Protein: 7.2 g/dL (ref 6.0–8.5)

## 2018-07-06 LAB — VITAMIN D 25 HYDROXY (VIT D DEFICIENCY, FRACTURES): Vit D, 25-Hydroxy: 36.2 ng/mL (ref 30.0–100.0)

## 2018-07-06 LAB — LIPID PANEL
Chol/HDL Ratio: 3.8 ratio (ref 0.0–4.4)
Cholesterol, Total: 227 mg/dL — ABNORMAL HIGH (ref 100–199)
HDL: 59 mg/dL (ref >39)
LDL Calculated: 143 mg/dL — ABNORMAL HIGH (ref 0–99)
Triglycerides: 123 mg/dL (ref 0–149)
VLDL Cholesterol Cal: 25 mg/dL (ref 5–40)

## 2018-07-06 LAB — CBC
Hematocrit: 45.1 % (ref 34.0–46.6)
Hemoglobin: 14.5 g/dL (ref 11.1–15.9)
MCH: 27.6 pg (ref 26.6–33.0)
MCHC: 32.2 g/dL (ref 31.5–35.7)
MCV: 86 fL (ref 79–97)
Platelets: 215 10*3/uL (ref 150–450)
RBC: 5.25 x10E6/uL (ref 3.77–5.28)
RDW: 13.4 % (ref 11.7–15.4)
WBC: 5.5 10*3/uL (ref 3.4–10.8)

## 2018-07-06 LAB — FOLLICLE STIMULATING HORMONE: FSH: 61.1 m[IU]/mL

## 2018-07-08 LAB — CYTOLOGY - PAP: Diagnosis: NEGATIVE

## 2018-07-12 ENCOUNTER — Telehealth: Payer: Self-pay | Admitting: *Deleted

## 2018-07-12 NOTE — Telephone Encounter (Signed)
-----   Message from Jerene Bears, MD sent at 07/12/2018  8:28 AM EDT ----- Please let pt know her Vit D and CBC were normal.  CMP showed mildly elevated glucose but she was not fasting with lab work.  Cholesterol is mildly elevated at 227 and LDLs.  Ok to monitor.  Treatment not needed at these levels.  FSH was 61 which shows menopause.  If she has any more bleeding, she needs to let me know.  Pap normal.  02 recall.

## 2018-07-12 NOTE — Telephone Encounter (Signed)
Left voice mail to call back 

## 2018-07-12 NOTE — Telephone Encounter (Signed)
Notes recorded by Loreta Ave, CMA on 07/12/2018 at 1:21 PM EDT Pt notified. Verbalized understanding. Patient requests copy of lab results. Results mailed to patient today.  AEX scheduled 07/10/19

## 2018-07-16 ENCOUNTER — Encounter: Payer: Self-pay | Admitting: Obstetrics & Gynecology

## 2018-08-31 DIAGNOSIS — B719 Cestode infection, unspecified: Secondary | ICD-10-CM | POA: Diagnosis not present

## 2018-08-31 DIAGNOSIS — R05 Cough: Secondary | ICD-10-CM | POA: Diagnosis not present

## 2018-09-03 DIAGNOSIS — B719 Cestode infection, unspecified: Secondary | ICD-10-CM | POA: Diagnosis not present

## 2018-09-05 ENCOUNTER — Ambulatory Visit: Payer: BLUE CROSS/BLUE SHIELD | Admitting: Obstetrics & Gynecology

## 2018-09-10 ENCOUNTER — Ambulatory Visit (INDEPENDENT_AMBULATORY_CARE_PROVIDER_SITE_OTHER): Payer: BC Managed Care – PPO | Admitting: Family Medicine

## 2018-09-10 ENCOUNTER — Other Ambulatory Visit: Payer: Self-pay

## 2018-09-10 ENCOUNTER — Encounter (INDEPENDENT_AMBULATORY_CARE_PROVIDER_SITE_OTHER): Payer: Self-pay | Admitting: Family Medicine

## 2018-09-10 VITALS — BP 132/82 | HR 66 | Temp 97.8°F | Ht 63.0 in | Wt 170.0 lb

## 2018-09-10 DIAGNOSIS — R739 Hyperglycemia, unspecified: Secondary | ICD-10-CM

## 2018-09-10 DIAGNOSIS — E7849 Other hyperlipidemia: Secondary | ICD-10-CM | POA: Diagnosis not present

## 2018-09-10 DIAGNOSIS — R5383 Other fatigue: Secondary | ICD-10-CM | POA: Diagnosis not present

## 2018-09-10 DIAGNOSIS — E669 Obesity, unspecified: Secondary | ICD-10-CM

## 2018-09-10 DIAGNOSIS — Z9189 Other specified personal risk factors, not elsewhere classified: Secondary | ICD-10-CM | POA: Diagnosis not present

## 2018-09-10 DIAGNOSIS — Z1331 Encounter for screening for depression: Secondary | ICD-10-CM | POA: Diagnosis not present

## 2018-09-10 DIAGNOSIS — E559 Vitamin D deficiency, unspecified: Secondary | ICD-10-CM | POA: Diagnosis not present

## 2018-09-10 DIAGNOSIS — Z6830 Body mass index (BMI) 30.0-30.9, adult: Secondary | ICD-10-CM

## 2018-09-10 DIAGNOSIS — E038 Other specified hypothyroidism: Secondary | ICD-10-CM

## 2018-09-10 DIAGNOSIS — E538 Deficiency of other specified B group vitamins: Secondary | ICD-10-CM | POA: Diagnosis not present

## 2018-09-10 DIAGNOSIS — R0602 Shortness of breath: Secondary | ICD-10-CM

## 2018-09-10 NOTE — Progress Notes (Signed)
Office: [phone]  /  Fax: [phone]   Dear Dr. Hyacinth Miranda,   Thank you for referring Deborah Miranda to our clinic. The following note includes my evaluation and treatment recommendations.  HPI:   Chief Complaint: OBESITY    Deborah Miranda has been referred by Deborah BearsMary S. Miller, MD for consultation regarding her obesity and obesity related comorbidities.    Deborah Miranda (MR# [id number]) is a 58 y.o. female who presents on 09/10/2018 for obesity evaluation and treatment. Current BMI is Body mass index is 30.11 kg/m.[name]. Deborah Miranda has been struggling with her weight for many years and has been unsuccessful in either losing weight, maintaining weight loss, or reaching her healthy weight goal.     Deborah Miranda attended our information session and states she is currently in the action stage of change and ready to dedicate time achieving and maintaining a healthier weight. Deborah Miranda is interested in becoming our patient and working on intensive lifestyle modifications including (but not limited to) diet, exercise and weight loss.    Deborah Miranda states her family eats meals together she thinks her family will eat healthier with  her her heaviest weight ever was 170 lbs. she has significant food cravings issues  she snacks frequently in the evenings she is frequently drinking liquids with calories she frequently eats larger portions than normal  she has binge eating behaviors she struggles with emotional eating    Fatigue [name] feels her energy is lower than it should be. This has worsened with weight gain and has not worsened recently. [name] admits to daytime somnolence and she admits to waking up still tired. Patient is at risk for obstructive sleep apnea. Patent has a history of symptoms of daytime fatigue, morning fatigue and morning headache. Patient generally gets 7 or 8 hours of sleep per night, and states they generally have restless sleep. Snoring is present. Apneic  episodes are not present. Epworth Sleepiness Score is 4  Dyspnea on exertion [name] notes increasing shortness of breath with exercising and seems to be worsening over time with weight gain. She notes getting out of breath sooner with activity than she used to. This has not gotten worse recently. [name] denies orthopnea.  Vitamin D deficiency [name] has a diagnosis of vitamin D deficiency. She is currently taking vit D and denies nausea, vomiting or muscle weakness.  B12 Deficiency [name] has a diagnosis of B12 insufficiency and she notes fatigue. She is on B12 supplement and she is trying to eat vegetarian.  Hypothyroid [name] has a diagnosis of hypothyroidism. She is on levothyroxine 75 mcg and 88 mcg alternating every other day. She admits to hot or cold intolerance.  Hyperglycemia [name] has a history of elevated A1c at 6.2 and a family history of diabetes. She denies polyphagia.  At risk for diabetes Deborah Miranda is at higher than average risk for developing diabetes due to her obesity and hyperglycemia. She currently denies polyuria or polydipsia.  Hyperlipidemia [name] has had elevated LDL in the past. She is not on statin. Deborah Miranda is attempting to improve her cholesterol levels with intensive lifestyle modification including a low saturated fat diet, exercise and weight loss. She denies any chest pain or myalgias.  Depression Screen [name]'s Food and Mood (modified PHQ-9) score was  Depression screen PHQ 2/9 09/10/2018  Decreased Interest 1  Down, Depressed, Hopeless 0  PHQ - 2 Score 1  Altered sleeping 1  Tired, decreased energy 2  Change in appetite 0  Feeling bad or failure about yourself  0  Trouble  concentrating 0  Moving slowly or fidgety/restless 0  Suicidal thoughts 0  PHQ-9 Score 4  Difficult doing work/chores Not difficult at all    ASSESSMENT AND PLAN:  Other fatigue - Plan: EKG 12-Lead, CBC With Differential, Folate, T3, T4, free, TSH  Shortness of breath on  exertion  Vitamin D deficiency - Plan: VITAMIN D 25 Hydroxy (Vit-D Deficiency, Fractures)  B12 nutritional deficiency - Plan: Vitamin B12  Other specified hypothyroidism  Hyperglycemia - Plan: Comprehensive metabolic panel, Hemoglobin A1c, Insulin, random  Other hyperlipidemia - Plan: Lipid Panel With LDL/HDL Ratio  At risk for diabetes mellitus  Depression screening  Class 1 obesity with serious comorbidity and body mass index (BMI) of 30.0 to 30.9 in adult, unspecified obesity type  PLAN:  Fatigue [name] was informed that her fatigue may be related to obesity, depression or many other causes. Labs will be ordered, and in the meanwhile [name] has agreed to work on diet, exercise and weight loss to help with fatigue. Proper sleep hygiene was discussed including the need for 7-8 hours of quality sleep each night. A sleep study was not ordered based on symptoms and Epworth score.  Dyspnea on exertion Deborah Miranda's shortness of breath appears to be obesity related and exercise induced. She has agreed to work on weight loss and gradually increase exercise to treat her exercise induced shortness of breath. If [name] follows our instructions and loses weight without improvement of her shortness of breath, we will plan to refer to pulmonology. We will monitor this condition regularly. [name] agrees to this plan.  Vitamin D Deficiency [name] was informed that low vitamin D levels contributes to fatigue and are associated with obesity, breast, and colon cancer. She will continue to take vitamin D and will follow up for routine testing of vitamin D, at least 2-3 times per year. She was informed of the risk of over-replacement of vitamin D and agrees to not increase her dose unless she discusses this with Deborah Miranda first. We will check labs and [name] agrees to follow up with our clinic in 2 weeks.  B12 Deficiency Deborah Miranda will work on increasing B12 rich foods in her diet. B12 supplementation was not  prescribed today. We will check labs and Deborah Miranda will follow up with our clinic in 2 weeks.  Hypothyroid Almer was informed of the importance of good thyroid control to help with weight loss efforts. She was also informed that supertherapeutic thyroid levels are dangerous and will not improve weight loss results. We will check labs and Deborah Miranda agrees to follow up with our clinic in 2 weeks.  Hyperglycemia Fasting labs will be obtained and results with be discussed with Deborah Miranda in 2 weeks at her follow up visit. In the meanwhile Meline was started on a lower simple carbohydrate diet and will work on weight loss efforts.  Diabetes risk counseling Kaiulani was given extended (30 minutes) diabetes prevention counseling today. She is 58 y.o. female and has risk factors for diabetes including obesity and hyperglycemia. We discussed intensive lifestyle modifications today with an emphasis on weight loss as well as increasing exercise and decreasing simple carbohydrates in her diet.  Hyperlipidemia Latangela was informed of the American Heart Association Guidelines emphasizing intensive lifestyle modifications as the first line treatment for hyperlipidemia. We discussed many lifestyle modifications today in depth, and [place] will begin to work on decreasing saturated fats such as fatty red meat, butter and many fried foods. She will also increase vegetables and lean protein in her diet and begin  to work on exercise and weight loss efforts. We will check labs and Tanekia will follow up with our clinic in 2 weeks.  Depression Screen Deborah Miranda had a negative depression screening. Depression is commonly associated with obesity and often results in emotional eating behaviors. We will monitor this closely and work on CBT to help improve the non-hunger eating patterns. Referral to Psychology may be required if no improvement is seen as she continues in our clinic.  Obesity Deborah Miranda is currently in the action stage of change  and her goal is to continue with weight loss efforts. I recommend Cheila begin the structured treatment plan as follows:  She has agreed to follow our pescatarian plan  Deborah Miranda has been instructed to eventually work up to a goal of 150 minutes of combined cardio and strengthening exercise per week for weight loss and overall health benefits. We discussed the following Behavioral Modification Strategies today: no skipping meals, increasing lean protein intake, decreasing simple carbohydrates, increasing vegetables and work on meal planning and easy cooking plans   She was informed of the importance of frequent follow up visits to maximize her success with intensive lifestyle modifications for her multiple health conditions. She was informed we would discuss her lab results at her next visit unless there is a critical issue that needs to be addressed sooner. Josiane agreed to keep her next visit at the agreed upon time to discuss these results.  ALLERGIES: Allergies  Allergen Reactions  . Other     Hornet sting    MEDICATIONS: Current Outpatient Medications on File Prior to Visit  Medication Sig Dispense Refill  . Cholecalciferol (VITAMIN D) 125 MCG (5000 UT) CAPS Take 5,000 Units by mouth daily.     . Cobalamin Combinations (B12 FOLATE) 800-800 MCG CAPS Take 1 tablet by mouth daily.    [name]. levothyroxine (SYNTHROID, LEVOTHROID) 75 MCG tablet Take 1 tablet (75 mcg total) by mouth daily. (Patient taking differently: Take 75 mcg by mouth every other day. ) 90 tablet 3  . Melatonin 5 MG TABS Take 1 tablet by mouth at bedtime.    [name]. SYNTHROID 88 MCG tablet Take 88 mcg daily (Patient taking differently: every other day. ) 90 tablet 3  . vitamin C (ASCORBIC ACID) 500 MG tablet Take 500 mg by mouth daily as needed.    . Zinc 30 MG CAPS Take 1 capsule by mouth daily as needed.     No current facility-administered medications on file prior to visit.     PAST MEDICAL HISTORY: Past Medical History:   Diagnosis Date  . CFS (chronic fatigue syndrome)   . Fatigue   . Goiter   . HLD (hyperlipidemia)   . HTN (hypertension)   . Hypothyroid    Goiter  . Prediabetes     PAST SURGICAL HISTORY: Past Surgical History:  Procedure Laterality Date  . COLONOSCOPY N/A 11/15/2015   Procedure: COLONOSCOPY;  Surgeon: [name] L Fields, MD;  Location: [facility];  Service: Endoscopy;  Laterality: N/A;  9:30 Am  . FOOT SURGERY     pinkie toe  . TONSILLECTOMY      SOCIAL HISTORY: Social History   Tobacco Use  . Smoking status: Never Smoker  . Smokeless tobacco: Never Used  Substance Use Topics  . Alcohol use: Not Currently  . Drug use: No    FAMILY HISTORY: Family History  Problem Relation Age of Onset  . Hypertension Father   . Hyperlipidemia Father   . Colon cancer Maternal Grandmother   .  Diabetes Paternal Grandmother   . Thyroid disease Mother     ROS: Review of Systems  Constitutional: Positive for malaise/fatigue.  Respiratory: Positive for shortness of breath (on exertion).   Cardiovascular: Negative for chest pain and orthopnea.  Gastrointestinal: Negative for nausea and vomiting.  Genitourinary: Negative for frequency.  Musculoskeletal: Negative for myalgias.       Negative for muscle weakness  Neurological: Positive for headaches.  Endo/Heme/Allergies: Negative for polydipsia.       Positive for Heat or Cold Intolerance Negative for polyphagia  Psychiatric/Behavioral:       Positive for Stress    PHYSICAL EXAM: Blood pressure 132/82, pulse 66, temperature 97.8 F (36.6 C), temperature source Oral, height 5\' 3"  (1.6 m), weight 170 lb (77.1 kg), SpO2 99 %. Body mass index is 30.11 kg/m. Physical Exam Vitals signs reviewed.  Constitutional:      Appearance: Normal appearance. She is well-developed. She is obese.  HENT:     Head: Normocephalic and atraumatic.     Nose: Nose normal.  Eyes:     General: No scleral icterus.    Extraocular Movements:  Extraocular movements intact.  Neck:     Musculoskeletal: Normal range of motion and neck supple.     Thyroid: No thyromegaly.  Cardiovascular:     Rate and Rhythm: Normal rate and regular rhythm.  Pulmonary:     Effort: Pulmonary effort is normal. No respiratory distress.  Abdominal:     Palpations: Abdomen is soft.     Tenderness: There is no abdominal tenderness.  Musculoskeletal: Normal range of motion.     Comments: Range of Motion normal in all 4 extremities  Skin:    General: Skin is warm and dry.  Neurological:     Mental Status: She is alert and oriented to person, place, and time.     Coordination: Coordination normal.  Psychiatric:        Mood and Affect: Mood normal.        Behavior: Behavior normal.     RECENT LABS AND TESTS: BMET    Component Value Date/Time   NA 139 07/05/2018 0942   K 4.1 07/05/2018 0942   CL 101 07/05/2018 0942   CO2 25 07/05/2018 0942   GLUCOSE 117 (H) 07/05/2018 0942   GLUCOSE 96 12/09/2015 1131   BUN 14 07/05/2018 0942   CREATININE 0.84 07/05/2018 0942   CREATININE 0.79 12/09/2015 1131   CALCIUM 9.5 07/05/2018 0942   GFRNONAA 76 07/05/2018 0942   GFRAA 88 07/05/2018 0942   Lab Results  Component Value Date   HGBA1C 6.2 03/25/2018   No results found for: INSULIN CBC    Component Value Date/Time   WBC 5.5 07/05/2018 0942   WBC 6.3 12/09/2015 1131   RBC 5.25 07/05/2018 0942   RBC 5.11 (H) 12/09/2015 1131   HGB 14.5 07/05/2018 0942   HGB 13.6 12/09/2015 1042   HCT 45.1 07/05/2018 0942   PLT 215 07/05/2018 0942   MCV 86 07/05/2018 0942   MCH 27.6 07/05/2018 0942   MCH 28.6 12/09/2015 1131   MCHC 32.2 07/05/2018 0942   MCHC 33.2 12/09/2015 1131   RDW 13.4 07/05/2018 0942   Iron/TIBC/Ferritin/ %Sat    Component Value Date/Time   FERRITIN 15 04/30/2014 1711   Lipid Panel     Component Value Date/Time   CHOL 227 (H) 07/05/2018 0942   TRIG 123 07/05/2018 0942   HDL 59 07/05/2018 0942   CHOLHDL 3.8 07/05/2018 4098  CHOLHDL 3.8 12/09/2015 1131   VLDL 24 12/09/2015 1131   LDLCALC 143 (H) 07/05/2018 0942   Hepatic Function Panel     Component Value Date/Time   PROT 7.2 07/05/2018 0942   ALBUMIN 4.5 07/05/2018 0942   AST 17 07/05/2018 0942   ALT 25 07/05/2018 0942   ALKPHOS 56 07/05/2018 0942   BILITOT 0.9 07/05/2018 0942      Component Value Date/Time   TSH 0.50 03/25/2018 1357   TSH 0.151 (L) 06/12/2017 0923   TSH 4.09 12/09/2015 1131    ECG  shows NSR with a rate of 63 BPM INDIRECT CALORIMETER done today shows a VO2 of 190 and a REE of 1319.  Her calculated basal metabolic rate is 2025 thus her basal metabolic rate is worse than expected.     Ref. Range 07/05/2018 09:42  Vitamin D, 25-Hydroxy Latest Ref Range: 30.0 - 100.0 ng/mL 36.2    OBESITY BEHAVIORAL INTERVENTION VISIT  Today's visit was # 1   Starting weight: 170 lbs Starting date: 09/10/2018 Today's weight : 170 lbs Today's date: 09/10/2018 Total lbs lost to date: 0    09/10/2018  Height 5\' 3"  (1.6 m)  Weight 170 lb (77.1 kg)  BMI (Calculated) 30.12  BLOOD PRESSURE - SYSTOLIC 427  BLOOD PRESSURE - DIASTOLIC 82   Body Fat % 06.2 %  Total Body Water (lbs) 69.6 lbs  RMR 1730    ASK: We discussed the diagnosis of obesity with Alphia Kava today and Beronica agreed to give [place] permission to discuss obesity behavioral modification therapy today.  ASSESS: Erdine has the diagnosis of obesity and her BMI today is 30.12 Meka is in the action stage of change   ADVISE: Presleigh was educated on the multiple health risks of obesity as well as the benefit of weight loss to improve her health. She was advised of the need for long term treatment and the importance of lifestyle modifications to improve her current health and to decrease her risk of future health problems.  AGREE: Multiple dietary modification options and treatment options were discussed and  Shley agreed to follow the recommendations documented in the above  note.  ARRANGE: Catrena was educated on the importance of frequent visits to treat obesity as outlined per CMS and USPSTF guidelines and agreed to schedule her next follow up appointment today.  I, Doreene Nest, am acting as transcriptionist for [employer]  I have reviewed the above documentation for accuracy and completeness, and I agree with the above. -Dennard Nip, MD

## 2018-09-11 LAB — COMPREHENSIVE METABOLIC PANEL
ALT: 25 IU/L (ref 0–32)
AST: 19 IU/L (ref 0–40)
Albumin/Globulin Ratio: 1.7 (ref 1.2–2.2)
Albumin: 4.8 g/dL (ref 3.8–4.9)
Alkaline Phosphatase: 65 IU/L (ref 39–117)
BUN/Creatinine Ratio: 15 (ref 9–23)
BUN: 12 mg/dL (ref 6–24)
Bilirubin Total: 1 mg/dL (ref 0.0–1.2)
CO2: 25 mmol/L (ref 20–29)
Calcium: 9.6 mg/dL (ref 8.7–10.2)
Chloride: 100 mmol/L (ref 96–106)
Creatinine, Ser: 0.78 mg/dL (ref 0.57–1.00)
GFR calc Af Amer: 96 mL/min/{1.73_m2} (ref >59)
GFR calc non Af Amer: 83 mL/min/{1.73_m2} (ref >59)
Globulin, Total: 2.9 g/dL (ref 1.5–4.5)
Glucose: 99 mg/dL (ref 65–99)
Potassium: 4.1 mmol/L (ref 3.5–5.2)
Sodium: 139 mmol/L (ref 134–144)
Total Protein: 7.7 g/dL (ref 6.0–8.5)

## 2018-09-11 LAB — CBC WITH DIFFERENTIAL
Basophils Absolute: 0.1 10*3/uL (ref 0.0–0.2)
Basos: 1 %
EOS (ABSOLUTE): 0.2 10*3/uL (ref 0.0–0.4)
Eos: 3 %
Hematocrit: 43.9 % (ref 34.0–46.6)
Hemoglobin: 14.4 g/dL (ref 11.1–15.9)
Immature Grans (Abs): 0 10*3/uL (ref 0.0–0.1)
Immature Granulocytes: 0 %
Lymphocytes Absolute: 2.3 10*3/uL (ref 0.7–3.1)
Lymphs: 42 %
MCH: 28 pg (ref 26.6–33.0)
MCHC: 32.8 g/dL (ref 31.5–35.7)
MCV: 85 fL (ref 79–97)
Monocytes Absolute: 0.5 10*3/uL (ref 0.1–0.9)
Monocytes: 9 %
Neutrophils Absolute: 2.5 10*3/uL (ref 1.4–7.0)
Neutrophils: 45 %
RBC: 5.14 x10E6/uL (ref 3.77–5.28)
RDW: 13.6 % (ref 11.7–15.4)
WBC: 5.5 10*3/uL (ref 3.4–10.8)

## 2018-09-11 LAB — FOLATE: Folate: >20 ng/mL (ref >3.0)

## 2018-09-11 LAB — INSULIN, RANDOM: INSULIN: 9.9 u[IU]/mL (ref 2.6–24.9)

## 2018-09-11 LAB — HEMOGLOBIN A1C
Est. average glucose Bld gHb Est-mCnc: 134 mg/dL
Hgb A1c MFr Bld: 6.3 % — ABNORMAL HIGH (ref 4.8–5.6)

## 2018-09-11 LAB — T3: T3, Total: 107 ng/dL (ref 71–180)

## 2018-09-11 LAB — LIPID PANEL WITH LDL/HDL RATIO
Cholesterol, Total: 250 mg/dL — ABNORMAL HIGH (ref 100–199)
HDL: 65 mg/dL (ref >39)
LDL Calculated: 160 mg/dL — ABNORMAL HIGH (ref 0–99)
LDl/HDL Ratio: 2.5 ratio (ref 0.0–3.2)
Triglycerides: 127 mg/dL (ref 0–149)
VLDL Cholesterol Cal: 25 mg/dL (ref 5–40)

## 2018-09-11 LAB — VITAMIN D 25 HYDROXY (VIT D DEFICIENCY, FRACTURES): Vit D, 25-Hydroxy: 38.8 ng/mL (ref 30.0–100.0)

## 2018-09-11 LAB — VITAMIN B12: Vitamin B-12: >2000 pg/mL — ABNORMAL HIGH (ref 232–1245)

## 2018-09-11 LAB — TSH: TSH: 1.41 u[IU]/mL (ref 0.450–4.500)

## 2018-09-11 LAB — T4, FREE: Free T4: 1.29 ng/dL (ref 0.82–1.77)

## 2018-09-17 NOTE — Progress Notes (Signed)
Office: [phone]  /  Fax: [phone]    Date: September 18, 2018  Time Seen:  3:08pm Duration: 43 minutes Provider: Glennie Isle, PsyD Type of Session: Intake for Individual Therapy  Type of Contact: Face-to-face  Informed Consent for In-Person Services During COVID-19: During today's appointment, information about the decision to initiate in-person services in light of the [id number]-48 public health crisis was discussed. Deborah Miranda and this provider agreed to meet in person for some or all future appointments. If there is a resurgence of the pandemic or other health concerns arise, alternate treatment options will be discussed as Deborah Miranda resides out of the state of [place]. Any related concerns will be discussed and an attempt to address them will be made. Moreover, the risks for opting for in-person services was discussed. Deborah Miranda verbally acknowledged understanding that by coming to the office, she is assuming the risk of exposure to the coronavirus or other public risk, and the risk may increase if Deborah Miranda travels by public transportation, cab, or [employer]. To obtain in-person services, Deborah Miranda verbally agreed to taking certain precautions (e.g., screening prior to appointment; universal masking; social distancing of 6 feet; proper hand hygiene; no visitors) set forth by [facility] to keep everyone safe from exposure, sickness, and possible death. This information was shared by front desk staff either at the time of scheduling and/or during the check-in process. Deborah Miranda expressed understanding that should she not adhere to these safeguards, it may result in the exploration of other options for treatment. Deborah Miranda acknowledged understanding that [facility] will follow the protocol set forth by [facility] should a patient present with a fever or other symptoms or disclose recent exposure, which will include rescheduling the appointment. Furthermore, Deborah Miranda acknowledged  understanding that precautions may change if additional local, state or federal orders or guidelines are published. This provider also shared that if Deborah Miranda tests positive for the coronavirus, this provider may be required to notify local health authorities that Deborah Miranda was in the [facility] clinic. Only minimum information necessary for data collection will be disclosed. This provider will follow [facility]'s disclosure policy should this provider or staff test positive for the coronavirus. To avoid handling of paper/writing instruments and increasing likelihood of touching, verbal consent was obtained by Nalee during today's appointment prior to proceeding. Deborah Miranda provided verbal consent to proceed, and acknowledged understanding that by verbally consenting to proceed, she is agreeable to all information noted above.   Informed Consent: The provider's role was explained to Deborah Miranda. The provider reviewed and discussed issues of confidentiality, privacy, and limits therein (e.g., reporting obligations). In addition to verbal informed consent, written informed consent for psychological services was obtained from Deborah Miranda prior to the initial intake interview. Written consent included information concerning the practice, financial arrangements, and confidentiality and patients' rights. Since the clinic is not a 24/7 crisis center, mental health emergency resources were provided in a handout format, and the provider explained MyChart, e-mail, voicemail, and/or other messaging systems should be utilized only for non-emergency reasons. This provider also explained that information obtained during appointments will be placed in [place] record in a confidential manner and relevant information will be shared with other providers at [facility] that she meets with for coordination of care. Deborah Miranda verbally acknowledged understanding of the aforementioned, and agreed to  use mental health emergency resources discussed if needed. Moreover, Deborah Miranda agreed information may be shared with other [facility] providers as needed for  coordination of care. By signing the service agreement document, Deborah Miranda provided written consent for coordination of care.   Chief Complaint/HPI: Deborah Miranda was referred by Dr. Dennard Nip. During the initial appointment with Dr. Dennard Nip at [facility] on September 10, 2018, Deborah Miranda reported experiencing the following: significant food cravings issues , snacking frequently in the evenings, frequently drinking liquids with calories, frequently eating larger portions than normal , binge eating behaviors and struggling with emotional eating.   During today's appointment, Deborah Miranda was verbally administered a questionnaire assessing various behaviors related to emotional eating. Deborah Miranda endorsed the following: overeat when you are celebrating, eat certain foods when you are anxious, stressed, depressed, or your feelings are hurt and find food is comforting to you. She shared she craves the following: chocolate. She denied a history of binge eating. Deborah Miranda denied a history of restricting food intake, purging and engagement in other compensatory strategies, and has never been diagnosed with an eating disorder. She also denied a history of treatment for emotional eating. After dinner, Deborah Miranda reported she will only eat if she is hungry. Moreover, Deborah Miranda stated, "Something I'm eating is wrong." She described eating "healthy," but continues to gain weight. Furthermore, Deborah Miranda denied other problems of concern.    Mental Status Examination:  Appearance: neat Behavior: cooperative Mood: euthymic Affect: mood congruent Speech: normal in rate, volume, and tone Eye Contact: appropriate Psychomotor Activity: appropriate Thought Process: linear, logical, and goal directed  Content/Perceptual Disturbances: denies suicidal and homicidal  ideation, plan, and intent and no hallucinations, delusions, bizarre thinking or behavior reported or observed Orientation: time, person, place and purpose of appointment Cognition/Sensorium: memory, attention, language, and fund of knowledge intact  Insight: fair Judgment: fair  Family & Psychosocial History: Deborah Miranda reported she is married and she has two adult children. 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 Additionally, Deborah Miranda shared her highest level of education obtained is a high school diploma. Currently, Deborah Miranda's social support system consists of her husband, children, mother, and sister. Moreover, Deborah Miranda stated she resides with her husband.   Medical History:  Past Medical History:  Diagnosis Date   CFS (chronic fatigue syndrome)    Fatigue    Goiter    HLD (hyperlipidemia)    HTN (hypertension)    Hypothyroid    Goiter   Prediabetes    Past Surgical History:  Procedure Laterality Date   COLONOSCOPY N/A 11/15/2015   Procedure: COLONOSCOPY;  Surgeon: Deborah Binder, MD;  [facility];  Service: Endoscopy;  Laterality: N/A;  9:30 Am   FOOT SURGERY     pinkie toe   TONSILLECTOMY     Current Outpatient Medications on File Prior to Visit  Medication Sig Dispense Refill   Cholecalciferol (VITAMIN D) 125 MCG (5000 UT) CAPS Take 5,000 Units by mouth daily.      Cobalamin Combinations (B12 FOLATE) 800-800 MCG CAPS Take 1 tablet by mouth daily.     levothyroxine (SYNTHROID, LEVOTHROID) 75 MCG tablet Take 1 tablet (75 mcg total) by mouth daily. (Patient taking differently: Take 75 mcg by mouth every other day. ) 90 tablet 3   Melatonin 5 MG TABS Take 1 tablet by mouth at bedtime.     SYNTHROID 88 MCG tablet Take 88 mcg daily (Patient taking differently: every other day. ) 90 tablet 3   vitamin C (ASCORBIC ACID) 500 MG tablet Take 500 mg by mouth daily as needed.     Zinc 30 MG CAPS  Take 1 capsule by mouth daily  as needed.     No current facility-administered medications on file prior to visit.   Deborah Miranda denied a history of head injuries and loss of consciousness.   Mental Health History: Deborah Miranda denied a history of therapeutic services. Deborah Miranda denied a history of hospitalizations for psychiatric concerns, and has never met with a psychiatrist. Ameria has never been prescribed psychotropic medications. Nalani denied a family history of mental health related concerns. Justis denied a trauma history, including psychological, physical  and sexual abuse, as well as neglect.   Juline described her typical mood as "very happy." Aside from concerns noted above and endorsed on the PHQ-9, Adiva reported experiencing decreased motivation, ongoing fatigue, feeling discouraged about weight loss, and decreased self-esteem due to weight. Julieanne endored current alcohol use. More specifically, she shared having a glass of red wine "once in a while." She denied tobacco use. She denied illicit/recreational substance use. Regarding caffeine intake, Joyell reported consuming one cup of coffee every morning. Furthermore, Willistine denied experiencing the following: hopelessness, memory concerns, hallucinations and delusions, paranoia, mania, crying spells and panic attacks. She also denied history of and current suicidal ideation, plan, and intent; history of and current homicidal ideation, plan, and intent; and history of and current engagement in self-harm.  The following strengths were reported by Marrissa: multi tasker, managing home, good organizer, and positive. The following strengths were observed by this provider: ability to express thoughts and feelings during the therapeutic session, ability to establish and benefit from a therapeutic relationship, ability to learn and practice coping skills, willingness to work toward established goal(s) with the clinic and ability to engage in reciprocal conversation.  Legal History: Ouita  denied a history of legal involvement.   Structured Assessment Results: The Patient Health Questionnaire-9 (PHQ-9) is a self-report measure that assesses symptoms and severity of depression over the course of the last two weeks. Myrtis obtained a score of 4 suggesting minimal depression. Bernise finds the endorsed symptoms to be not difficult at all. Little interest or pleasure in doing things 0  Feeling down, depressed, or hopeless 0  Trouble falling or staying asleep, or sleeping too much 1  Feeling tired or having little energy 3  Poor appetite or overeating 0  Feeling bad about yourself --- or that you are a failure or have let yourself or your family down 0  Trouble concentrating on things, such as reading the newspaper or watching television 0  Moving or speaking so slowly that other people could have noticed? Or the opposite --- being so fidgety or restless that you have been moving around a lot more than usual 0  Thoughts that you would be better off dead or hurting yourself in some way 0  PHQ-9 Score 4    The Generalized Anxiety Disorder-7 (GAD-7) is a brief self-report measure that assesses symptoms of anxiety over the course of the last two weeks. Addisson obtained a score of 0. Feeling nervous, anxious, on edge 0  Not being able to stop or control worrying 0  Worrying too much about different things 0  Trouble relaxing 0  Being so restless that it's hard to sit still 0  Becoming easily annoyed or irritable 0  Feeling afraid as if something awful might happen 0  GAD-7 Score 0   Interventions: A chart review was conducted prior to the clinical intake interview. The  PHQ-9, and GAD-7 were verbally administered and a clinical intake interview was completed. In addition, a  Mood and Food questionnaire was verbally administered to assess various behaviors related to emotional eating. Throughout session, empathic reflections and validation was provided. Psychoeducation regarding emotional  versus physical hunger was provided. Marlia was given a handout to utilize to increase awareness of hunger patterns and subsequent eating. This provider also shared behavioral strategies to help Chardonnay slow down when eating as she described not experiencing satisfaction when eating as she often eats fast.   Provisional DSM-5 Diagnosis: 311 (F32.8) Other Specified Depressive Disorder, Emotional Eating Behaviors  Plan: Dann declined future appointments with this provider. She acknowledged understanding that she may request a follow-up appointment with this provider in the future as long as she is still established with the clinic. No further follow-up planned by this provider.

## 2018-09-18 ENCOUNTER — Other Ambulatory Visit: Payer: Self-pay

## 2018-09-18 ENCOUNTER — Ambulatory Visit (INDEPENDENT_AMBULATORY_CARE_PROVIDER_SITE_OTHER): Payer: BC Managed Care – PPO | Admitting: Psychology

## 2018-09-18 DIAGNOSIS — F3289 Other specified depressive episodes: Secondary | ICD-10-CM | POA: Diagnosis not present

## 2018-09-24 ENCOUNTER — Other Ambulatory Visit: Payer: Self-pay

## 2018-09-24 ENCOUNTER — Ambulatory Visit (INDEPENDENT_AMBULATORY_CARE_PROVIDER_SITE_OTHER): Payer: BC Managed Care – PPO | Admitting: Family Medicine

## 2018-09-24 ENCOUNTER — Encounter (INDEPENDENT_AMBULATORY_CARE_PROVIDER_SITE_OTHER): Payer: Self-pay | Admitting: Family Medicine

## 2018-09-24 VITALS — BP 136/78 | HR 73 | Temp 97.9°F | Ht 63.0 in | Wt 162.0 lb

## 2018-09-24 DIAGNOSIS — Z9189 Other specified personal risk factors, not elsewhere classified: Secondary | ICD-10-CM

## 2018-09-24 DIAGNOSIS — E669 Obesity, unspecified: Secondary | ICD-10-CM

## 2018-09-24 DIAGNOSIS — Z6830 Body mass index (BMI) 30.0-30.9, adult: Secondary | ICD-10-CM

## 2018-09-24 DIAGNOSIS — E559 Vitamin D deficiency, unspecified: Secondary | ICD-10-CM

## 2018-09-24 DIAGNOSIS — R7303 Prediabetes: Secondary | ICD-10-CM

## 2018-09-24 MED ORDER — VITAMIN D (ERGOCALCIFEROL) 1.25 MG (50000 UNIT) PO CAPS: 50000.0000 [IU] | ORAL_CAPSULE | ORAL | 0 refills | Status: DC

## 2018-09-26 ENCOUNTER — Ambulatory Visit: Payer: BLUE CROSS/BLUE SHIELD | Admitting: Internal Medicine

## 2018-09-26 NOTE — Progress Notes (Signed)
Office: [phone]  /  Fax: [phone]   HPI:   Chief Complaint: OBESITY Deborah Miranda is here to discuss her progress with her obesity treatment plan. She is on the Pescatarian eating plan and is following her eating plan approximately 100% of the time. She states she is walking 20-30 minutes 7 times per week. [name] has done very well on her diet prescription. She is happy with her food choices and states hunger was well controlled. Her weight is 162 lb (73.5 kg) today and has had a weight loss of 8 pounds over a period of 2 weeks since her last visit. She has lost 8 lbs since starting treatment with us.  Vitamin D deficiency [name] has a diagnosis of Vitamin D deficiency, which is not yet at goal. She is currently taking OTC Vit D multivitamin and denies nausea, vomiting or muscle weakness.  Pre-Diabetes [name] has a new diagnosis of prediabetes based on her elevated Hgb A1c and was informed this puts her at greater risk of developing diabetes. Last A1c was elevated at 6.3 on 09/10/2018. She continues to work on diet and exercise to decrease risk of diabetes. She reports polyphagia, but this has improved on her diet prescription.  At risk for diabetes [name] is at higher than average risk for developing diabetes due to her obesity. She currently denies polyuria or polydipsia.  ASSESSMENT AND PLAN:  Vitamin D deficiency - Plan: Vitamin D, Ergocalciferol, (DRISDOL) 1.25 MG (50000 UT) CAPS capsule  Prediabetes  At risk for diabetes mellitus  Class 1 obesity with serious comorbidity and body mass index (BMI) of 30.0 to 30.9 in adult, unspecified obesity type  PLAN:  Vitamin D Deficiency [name] was informed that low Vitamin D levels contributes to fatigue and are associated with obesity, breast, and colon cancer. She agrees to start taking prescription Vit D @ 50,000 IU every week #4 with 0 refills and will follow-up for routine testing of Vitamin D, at least 2-3 times per year. She  was informed of the risk of over-replacement of Vitamin D and agrees to not increase her dose unless she discusses this with us first. Livianna agrees to follow-up with our clinic in 2 weeks.  Pre-Diabetes [name] will continue to work on weight loss, exercise, and decreasing simple carbohydrates in her diet to help decrease the risk of diabetes. We dicussed metformin including benefits and risks. She was informed that eating too many simple carbohydrates or too many calories at one sitting increases the likelihood of GI side effects. [name] will continue diet and exercise. She will follow-up with us as directed to monitor her progress.  Diabetes risk counseling [name] was given extended (30 minutes) diabetes prevention counseling today. She is 58 y.o. female and has risk factors for diabetes including obesity. We discussed intensive lifestyle modifications today with an emphasis on weight loss as well as increasing exercise and decreasing simple carbohydrates in her diet.  Obesity [name] is currently in the action stage of change. As such, her goal is to continue with weight loss efforts. She has agreed to follow the Pescatarian eating plan. [name] has been instructed to work up to a goal of 150 minutes of combined cardio and strengthening exercise per week for weight loss and overall health benefits. We discussed the following Behavioral Modification Strategies today: increasing lean protein intake.  [name] has agreed to follow-up with our clinic in 2 weeks. She was informed of the importance of frequent follow-up visits to maximize her success with intensive lifestyle modifications  for her multiple health conditions.  ALLERGIES: Allergies  Allergen Reactions  . Other     Hornet sting    MEDICATIONS: Current Outpatient Medications on File Prior to Visit  Medication Sig Dispense Refill  . Cholecalciferol (VITAMIN D) 125 MCG (5000 UT) CAPS Take 5,000 Units by mouth daily.     . Cobalamin  Combinations (B12 FOLATE) 800-800 MCG CAPS Take 1 tablet by mouth daily.    [name]. levothyroxine (SYNTHROID, LEVOTHROID) 75 MCG tablet Take 1 tablet (75 mcg total) by mouth daily. (Patient taking differently: Take 75 mcg by mouth every other day. ) 90 tablet 3  . Melatonin 5 MG TABS Take 1 tablet by mouth at bedtime.    [name]. SYNTHROID 88 MCG tablet Take 88 mcg daily (Patient taking differently: every other day. ) 90 tablet 3  . vitamin C (ASCORBIC ACID) 500 MG tablet Take 500 mg by mouth daily as needed.    . Zinc 30 MG CAPS Take 1 capsule by mouth daily as needed.     No current facility-administered medications on file prior to visit.     PAST MEDICAL HISTORY: Past Medical History:  Diagnosis Date  . CFS (chronic fatigue syndrome)   . Fatigue   . Goiter   . HLD (hyperlipidemia)   . HTN (hypertension)   . Hypothyroid    Goiter  . Prediabetes     PAST SURGICAL HISTORY: Past Surgical History:  Procedure Laterality Date  . COLONOSCOPY N/A 11/15/2015   Procedure: COLONOSCOPY;  Surgeon: [name] L Fields, MD;  Location: [facility];  Service: Endoscopy;  Laterality: N/A;  9:30 Am  . FOOT SURGERY     pinkie toe  . TONSILLECTOMY      SOCIAL HISTORY: Social History   Tobacco Use  . Smoking status: Never Smoker  . Smokeless tobacco: Never Used  Substance Use Topics  . Alcohol use: Not Currently  . Drug use: No    FAMILY HISTORY: Family History  Problem Relation Age of Onset  . Hypertension Father   . Hyperlipidemia Father   . Colon cancer Maternal Grandmother   . Diabetes Paternal Grandmother   . Thyroid disease Mother    ROS: Review of Systems  Gastrointestinal: Negative for nausea and vomiting.  Musculoskeletal:       Negative for muscle weakness.  Endo/Heme/Allergies:       Positive for polyphagia, improved on diet prescription.   PHYSICAL EXAM: Blood pressure 136/78, pulse 73, temperature 97.9 F (36.6 C), temperature source Oral, height 5\' 3"  (1.6 m), weight 162 lb  (73.5 kg), SpO2 97 %. Body mass index is 28.7 kg/m. Physical Exam Vitals signs reviewed.  Constitutional:      Appearance: Normal appearance. She is obese.  Cardiovascular:     Rate and Rhythm: Normal rate.     Pulses: Normal pulses.  Pulmonary:     Effort: Pulmonary effort is normal.     Breath sounds: Normal breath sounds.  Musculoskeletal: Normal range of motion.  Skin:    General: Skin is warm and dry.  Neurological:     Mental Status: She is alert and oriented to person, place, and time.  Psychiatric:        Behavior: Behavior normal.   RECENT LABS AND TESTS: BMET    Component Value Date/Time   NA 139 09/10/2018 1209   K 4.1 09/10/2018 1209   CL 100 09/10/2018 1209   CO2 25 09/10/2018 1209   GLUCOSE 99 09/10/2018 1209   GLUCOSE 96  12/09/2015 1131   BUN 12 09/10/2018 1209   CREATININE 0.78 09/10/2018 1209   CREATININE 0.79 12/09/2015 1131   CALCIUM 9.6 09/10/2018 1209   GFRNONAA 83 09/10/2018 1209   GFRAA 96 09/10/2018 1209   Lab Results  Component Value Date   HGBA1C 6.3 (H) 09/10/2018   HGBA1C 6.2 03/25/2018   HGBA1C 6.2 (H) 06/12/2017   Lab Results  Component Value Date   INSULIN 9.9 09/10/2018   CBC    Component Value Date/Time   WBC 5.5 09/10/2018 1209   WBC 6.3 12/09/2015 1131   RBC 5.14 09/10/2018 1209   RBC 5.11 (H) 12/09/2015 1131   HGB 14.4 09/10/2018 1209   HGB 13.6 12/09/2015 1042   HCT 43.9 09/10/2018 1209   PLT 215 07/05/2018 0942   MCV 85 09/10/2018 1209   MCH 28.0 09/10/2018 1209   MCH 28.6 12/09/2015 1131   MCHC 32.8 09/10/2018 1209   MCHC 33.2 12/09/2015 1131   RDW 13.6 09/10/2018 1209   LYMPHSABS 2.3 09/10/2018 1209   EOSABS 0.2 09/10/2018 1209   BASOSABS 0.1 09/10/2018 1209   Iron/TIBC/Ferritin/ %Sat    Component Value Date/Time   FERRITIN 15 04/30/2014 1711   Lipid Panel     Component Value Date/Time   CHOL 250 (H) 09/10/2018 1209   TRIG 127 09/10/2018 1209   HDL 65 09/10/2018 1209   CHOLHDL 3.8 07/05/2018 0942    CHOLHDL 3.8 12/09/2015 1131   VLDL 24 12/09/2015 1131   LDLCALC 160 (H) 09/10/2018 1209   Hepatic Function Panel     Component Value Date/Time   PROT 7.7 09/10/2018 1209   ALBUMIN 4.8 09/10/2018 1209   AST 19 09/10/2018 1209   ALT 25 09/10/2018 1209   ALKPHOS 65 09/10/2018 1209   BILITOT 1.0 09/10/2018 1209      Component Value Date/Time   TSH 1.410 09/10/2018 1209   TSH 0.50 03/25/2018 1357   TSH 0.151 (L) 06/12/2017 0923   Results for NINNIE, FEIN (MRN [id number]) as of 09/26/2018 08:36  Ref. Range 09/10/2018 12:09  Vitamin D, 25-Hydroxy Latest Ref Range: 30.0 - 100.0 ng/mL 38.8   OBESITY BEHAVIORAL INTERVENTION VISIT  Today's visit was #2  Starting weight: 170 lbs Starting date: 09/10/2018 Today's weight: 162 lbs  Today's date: 09/24/2018 Total lbs lost to date: 8    09/24/2018  Height 5\' 3"  (1.6 m)  Weight 162 lb (73.5 kg)  BMI (Calculated) 28.7  BLOOD PRESSURE - SYSTOLIC 841  BLOOD PRESSURE - DIASTOLIC 78   Body Fat % 32.4 %  Total Body Water (lbs) 69.6 lbs   ASK: We discussed the diagnosis of obesity with Alphia Kava today and Elynore agreed to give [place] permission to discuss obesity behavioral modification therapy today.  ASSESS: Meagan has the diagnosis of obesity and her BMI today is 28.8. Najmo is in the action stage of change.   ADVISE: Maedell was educated on the multiple health risks of obesity as well as the benefit of weight loss to improve her health. She was advised of the need for long term treatment and the importance of lifestyle modifications to improve her current health and to decrease her risk of future health problems.  AGREE: Multiple dietary modification options and treatment options were discussed and  Shanitha agreed to follow the recommendations documented in the above note.  ARRANGE: Teriana was educated on the importance of frequent visits to treat obesity as outlined per CMS and USPSTF guidelines and agreed to  schedule her next follow  up appointment today.  I, Marianna Paymentenise Haag, am acting as Energy managertranscriptionist for Quillian Quincearen Rosan Calbert, MD I have reviewed the above documentation for accuracy and completeness, and I agree with the above. -Quillian Quincearen Zorawar Strollo, MD

## 2018-10-10 ENCOUNTER — Encounter (INDEPENDENT_AMBULATORY_CARE_PROVIDER_SITE_OTHER): Payer: Self-pay | Admitting: Family Medicine

## 2018-10-10 ENCOUNTER — Ambulatory Visit (INDEPENDENT_AMBULATORY_CARE_PROVIDER_SITE_OTHER): Payer: BC Managed Care – PPO | Admitting: Family Medicine

## 2018-10-10 ENCOUNTER — Other Ambulatory Visit: Payer: Self-pay

## 2018-10-10 VITALS — BP 135/86 | HR 70 | Temp 98.2°F | Ht 63.0 in | Wt 157.0 lb

## 2018-10-10 DIAGNOSIS — Z6830 Body mass index (BMI) 30.0-30.9, adult: Secondary | ICD-10-CM | POA: Diagnosis not present

## 2018-10-10 DIAGNOSIS — E559 Vitamin D deficiency, unspecified: Secondary | ICD-10-CM | POA: Diagnosis not present

## 2018-10-10 DIAGNOSIS — Z9189 Other specified personal risk factors, not elsewhere classified: Secondary | ICD-10-CM

## 2018-10-10 DIAGNOSIS — E669 Obesity, unspecified: Secondary | ICD-10-CM

## 2018-10-10 NOTE — Progress Notes (Signed)
Office: [phone]  /  Fax: [phone]   HPI:   Chief Complaint: OBESITY Deborah Miranda is here to discuss her progress with her obesity treatment plan. She is on the Pescatarian eating plan and is following her eating plan approximately 100 % of the time. She states she is exercising 0 minutes 0 times per week. Kevyn continues to do well with weight loss on her plan. She likes her food options and her hunger is controlled. She walks her dog for exercise daily. Her weight is 157 lb (71.2 kg) today and has had a weight loss of 5 pounds over a period of 2 weeks since her last visit. She has lost 13 lbs since starting treatment with [place].  Vitamin D deficiency Deborah Miranda has a diagnosis of vitamin D deficiency. She is on vit D, but pharmacy error didn't fill. Deborah Miranda denies nausea, vomiting or muscle weakness.  At risk for osteopenia and osteoporosis Deborah Miranda is at higher risk of osteopenia and osteoporosis due to vitamin D deficiency.   ASSESSMENT AND PLAN:  Vitamin D deficiency  At risk for osteoporosis  Class 1 obesity with serious comorbidity and body mass index (BMI) of 30.0 to 30.9 in adult, unspecified obesity type - Starting BMI greater then 30  PLAN:  Vitamin D Deficiency Deborah Miranda was informed that low vitamin D levels contributes to fatigue and are associated with obesity, breast, and colon cancer. Deborah Miranda agrees to continue prescription Vit D @50 ,000 IU every week #4 with no refills and she will follow up for routine testing of vitamin D, at least 2-3 times per year. She was informed of the risk of over-replacement of vitamin D and agrees to not increase her dose unless she discusses this with [place] first. Deborah Miranda agrees to follow up with our clinic in 2 weeks.  At risk for osteopenia and osteoporosis Deborah Miranda was given extended  (15 minutes) osteoporosis prevention counseling today. Deborah Miranda is at risk for osteopenia and osteoporosis due to her vitamin D deficiency. She was encouraged to take  her vitamin D and follow her higher calcium diet and increase strengthening exercise to help strengthen her bones and decrease her risk of osteopenia and osteoporosis.  Obesity Deborah Miranda is currently in the action stage of change. As such, her goal is to continue with weight loss efforts She has agreed to follow the Pescatarian eating plan Deborah Miranda will start counting her steps daily to get activity baseline. We discussed the following Behavioral Modification Strategies today: increasing lean protein intake and work on meal planning and easy cooking plans  Deborah Miranda has agreed to follow up with our clinic in 2 weeks. She was informed of the importance of frequent follow up visits to maximize her success with intensive lifestyle modifications for her multiple health conditions.  ALLERGIES: Allergies  Allergen Reactions  . Other     Hornet sting    MEDICATIONS: Current Outpatient Medications on File Prior to Visit  Medication Sig Dispense Refill  . Cobalamin Combinations (B12 FOLATE) 800-800 MCG CAPS Take 1 tablet by mouth daily.    [name] levothyroxine (SYNTHROID, LEVOTHROID) 75 MCG tablet Take 1 tablet (75 mcg total) by mouth daily. (Patient taking differently: Take 75 mcg by mouth every other day. ) 90 tablet 3  . Melatonin 5 MG TABS Take 1 tablet by mouth at bedtime.    [name] SYNTHROID 88 MCG tablet Take 88 mcg daily (Patient taking differently: every other day. ) 90 tablet 3  . vitamin C (ASCORBIC ACID) 500 MG tablet Take 500 mg  by mouth daily as needed.    . Vitamin D, Ergocalciferol, (DRISDOL) 1.25 MG (50000 UT) CAPS capsule Take 1 capsule (50,000 Units total) by mouth every 7 (seven) days. 4 capsule 0  . Zinc 30 MG CAPS Take 1 capsule by mouth daily as needed.     No current facility-administered medications on file prior to visit.     PAST MEDICAL HISTORY: Past Medical History:  Diagnosis Date  . CFS (chronic fatigue syndrome)   . Fatigue   . Goiter   . HLD (hyperlipidemia)   . HTN  (hypertension)   . Hypothyroid    Goiter  . Prediabetes     PAST SURGICAL HISTORY: Past Surgical History:  Procedure Laterality Date  . COLONOSCOPY N/A 11/15/2015   Procedure: COLONOSCOPY;  Surgeon: [name] L Fields, MD;  Location: [facility];  Service: Endoscopy;  Laterality: N/A;  9:30 Am  . FOOT SURGERY     pinkie toe  . TONSILLECTOMY      SOCIAL HISTORY: Social History   Tobacco Use  . Smoking status: Never Smoker  . Smokeless tobacco: Never Used  Substance Use Topics  . Alcohol use: Not Currently  . Drug use: No    FAMILY HISTORY: Family History  Problem Relation Age of Onset  . Hypertension Father   . Hyperlipidemia Father   . Colon cancer Maternal Grandmother   . Diabetes Paternal Grandmother   . Thyroid disease Mother     ROS: Review of Systems  Constitutional: Positive for weight loss.  Gastrointestinal: Negative for nausea and vomiting.  Musculoskeletal:       Negative for muscle weakness    PHYSICAL EXAM: Blood pressure 135/86, pulse 70, temperature 98.2 F (36.8 C), temperature source Oral, height 5\' 3"  (1.6 m), weight 157 lb (71.2 kg), SpO2 99 %. Body mass index is 27.81 kg/m. Physical Exam Vitals signs reviewed.  Constitutional:      Appearance: Normal appearance. She is well-developed. She is obese.  Cardiovascular:     Rate and Rhythm: Normal rate.  Pulmonary:     Effort: Pulmonary effort is normal.  Musculoskeletal: Normal range of motion.  Skin:    General: Skin is warm and dry.  Neurological:     Mental Status: She is alert and oriented to person, place, and time.  Psychiatric:        Mood and Affect: Mood normal.        Behavior: Behavior normal.     RECENT LABS AND TESTS: BMET    Component Value Date/Time   NA 139 09/10/2018 1209   K 4.1 09/10/2018 1209   CL 100 09/10/2018 1209   CO2 25 09/10/2018 1209   GLUCOSE 99 09/10/2018 1209   GLUCOSE 96 12/09/2015 1131   BUN 12 09/10/2018 1209   CREATININE 0.78 09/10/2018 1209    CREATININE 0.79 12/09/2015 1131   CALCIUM 9.6 09/10/2018 1209   GFRNONAA 83 09/10/2018 1209   GFRAA 96 09/10/2018 1209   Lab Results  Component Value Date   HGBA1C 6.3 (H) 09/10/2018   HGBA1C 6.2 03/25/2018   HGBA1C 6.2 (H) 06/12/2017   Lab Results  Component Value Date   INSULIN 9.9 09/10/2018   CBC    Component Value Date/Time   WBC 5.5 09/10/2018 1209   WBC 6.3 12/09/2015 1131   RBC 5.14 09/10/2018 1209   RBC 5.11 (H) 12/09/2015 1131   HGB 14.4 09/10/2018 1209   HGB 13.6 12/09/2015 1042   HCT 43.9 09/10/2018 1209   PLT  215 07/05/2018 0942   MCV 85 09/10/2018 1209   MCH 28.0 09/10/2018 1209   MCH 28.6 12/09/2015 1131   MCHC 32.8 09/10/2018 1209   MCHC 33.2 12/09/2015 1131   RDW 13.6 09/10/2018 1209   LYMPHSABS 2.3 09/10/2018 1209   EOSABS 0.2 09/10/2018 1209   BASOSABS 0.1 09/10/2018 1209   Iron/TIBC/Ferritin/ %Sat    Component Value Date/Time   FERRITIN 15 04/30/2014 1711   Lipid Panel     Component Value Date/Time   CHOL 250 (H) 09/10/2018 1209   TRIG 127 09/10/2018 1209   HDL 65 09/10/2018 1209   CHOLHDL 3.8 07/05/2018 0942   CHOLHDL 3.8 12/09/2015 1131   VLDL 24 12/09/2015 1131   LDLCALC 160 (H) 09/10/2018 1209   Hepatic Function Panel     Component Value Date/Time   PROT 7.7 09/10/2018 1209   ALBUMIN 4.8 09/10/2018 1209   AST 19 09/10/2018 1209   ALT 25 09/10/2018 1209   ALKPHOS 65 09/10/2018 1209   BILITOT 1.0 09/10/2018 1209      Component Value Date/Time   TSH 1.410 09/10/2018 1209   TSH 0.50 03/25/2018 1357   TSH 0.151 (L) 06/12/2017 0923    Results for DEBORIA, APLEY (MRN [id number]) as of 10/10/2018 13:46  Ref. Range 09/10/2018 12:09  Vitamin D, 25-Hydroxy Latest Ref Range: 30.0 - 100.0 ng/mL 38.8    OBESITY BEHAVIORAL INTERVENTION VISIT  Today's visit was # 3   Starting weight: 170 lbs Starting date: 09/10/2018 Today's weight : 157 lbs Today's date: 10/10/2018 Total lbs lost to date: 13    10/10/2018  Height 5\' 3"   (1.6 m)  Weight 157 lb (71.2 kg)  BMI (Calculated) 27.82  BLOOD PRESSURE - SYSTOLIC 135  BLOOD PRESSURE - DIASTOLIC 86   Body Fat % 33.8 %  Total Body Water (lbs) 66.8 lbs    ASK: We discussed the diagnosis of obesity with Deborah Miranda today and Deborah Miranda agreed to give [place] permission to discuss obesity behavioral modification therapy today.  ASSESS: Deborah Miranda has the diagnosis of obesity and her BMI today is 27.82 Deborah Miranda is in the action stage of change   ADVISE: Deborah Miranda was educated on the multiple health risks of obesity as well as the benefit of weight loss to improve her health. She was advised of the need for long term treatment and the importance of lifestyle modifications to improve her current health and to decrease her risk of future health problems.  AGREE: Multiple dietary modification options and treatment options were discussed and  Deborah Miranda agreed to follow the recommendations documented in the above note.  ARRANGE: Deborah Miranda was educated on the importance of frequent visits to treat obesity as outlined per CMS and USPSTF guidelines and agreed to schedule her next follow up appointment today.  I, [place] Crane, am acting as transcriptionist for Quillian Quince, MD I have reviewed the above documentation for accuracy and completeness, and I agree with the above. -Quillian Quince, MD

## 2018-10-24 ENCOUNTER — Ambulatory Visit (INDEPENDENT_AMBULATORY_CARE_PROVIDER_SITE_OTHER): Payer: BC Managed Care – PPO | Admitting: Family Medicine

## 2018-10-24 ENCOUNTER — Other Ambulatory Visit: Payer: Self-pay

## 2018-10-24 ENCOUNTER — Encounter (INDEPENDENT_AMBULATORY_CARE_PROVIDER_SITE_OTHER): Payer: Self-pay | Admitting: Family Medicine

## 2018-10-24 VITALS — BP 114/74 | HR 65 | Temp 97.8°F | Ht 63.0 in | Wt 154.0 lb

## 2018-10-24 DIAGNOSIS — R7303 Prediabetes: Secondary | ICD-10-CM

## 2018-10-24 DIAGNOSIS — E669 Obesity, unspecified: Secondary | ICD-10-CM | POA: Diagnosis not present

## 2018-10-24 DIAGNOSIS — Z6830 Body mass index (BMI) 30.0-30.9, adult: Secondary | ICD-10-CM | POA: Diagnosis not present

## 2018-10-29 NOTE — Progress Notes (Signed)
Office: [phone]  /  Fax: [phone]   HPI:   Chief Complaint: OBESITY Deborah Miranda is here to discuss her progress with her obesity treatment plan. She is on the Pescatarian eating plan and is following her eating plan approximately 99 % of the time. She states she is walking the dog for 20-30 minutes 6-7 times per week. Deborah Miranda continues to do well with weight loss on her Pescatarian plan. Her hunger is controlled and she likes her food choices. She has questions about food choices, and eating out strategies and meal planning.  Her weight is 154 lb (69.9 kg) today and has had a weight loss of 3 pounds over a period of 2 weeks since her last visit. She has lost 16 lbs since starting treatment with [place].  Pre-Diabetes Deborah Miranda has a diagnosis of pre-diabetes based on her elevated Hgb A1c and was informed this puts her at greater risk of developing diabetes. Last A1c was elevated at 6.3. She is not taking metformin currently and is attempting to control with diet and exercise to decrease risk of diabetes. She denies nausea, vomiting, or hypoglycemia.  ASSESSMENT AND PLAN:  Prediabetes  Class 1 obesity with serious comorbidity and body mass index (BMI) of 30.0 to 30.9 in adult, unspecified obesity type - Starting BMI greater then 30  PLAN:  Pre-Diabetes Deborah Miranda will continue to work on diet, exercise, weight loss, and decreasing simple carbohydrates in her diet to help decrease the risk of diabetes. We dicussed metformin including benefits and risks. She was informed that eating too many simple carbohydrates or too many calories at one sitting increases the likelihood of GI side effects. Deborah Miranda agrees to follow up with our clinic in 2 weeks as directed to monitor her progress.  I spent > than 50% of the 25 minute visit on counseling as documented in the note.  Obesity Deborah Miranda is currently in the action stage of change. As such, her goal is to continue with weight loss efforts She has agreed  to follow the Pescatarian eating plan Deborah Miranda has been instructed to work up to a goal of 150 minutes of combined cardio and strengthening exercise per week or increase strengthening exercise with walking for weight loss and overall health benefits. We discussed the following Behavioral Modification Strategies today: work on meal planning and easy cooking plans, travel eating strategies, celebration eating strategies   Deborah Miranda has agreed to follow up with our clinic in 2 weeks. She was informed of the importance of frequent follow up visits to maximize her success with intensive lifestyle modifications for her multiple health conditions.  ALLERGIES: Allergies  Allergen Reactions  . Other     Hornet sting    MEDICATIONS: Current Outpatient Medications on File Prior to Visit  Medication Sig Dispense Refill  . Cobalamin Combinations (B12 FOLATE) 800-800 MCG CAPS Take 1 tablet by mouth daily.    [name] levothyroxine (SYNTHROID, LEVOTHROID) 75 MCG tablet Take 1 tablet (75 mcg total) by mouth daily. (Patient taking differently: Take 75 mcg by mouth every other day. ) 90 tablet 3  . Melatonin 5 MG TABS Take 1 tablet by mouth at bedtime.    [name] SYNTHROID 88 MCG tablet Take 88 mcg daily (Patient taking differently: every other day. ) 90 tablet 3  . vitamin C (ASCORBIC ACID) 500 MG tablet Take 500 mg by mouth daily as needed.    . Vitamin D, Ergocalciferol, (DRISDOL) 1.25 MG (50000 UT) CAPS capsule Take 1 capsule (50,000 Units total) by mouth every  7 (seven) days. 4 capsule 0  . Zinc 30 MG CAPS Take 1 capsule by mouth daily as needed.     No current facility-administered medications on file prior to visit.     PAST MEDICAL HISTORY: Past Medical History:  Diagnosis Date  . CFS (chronic fatigue syndrome)   . Fatigue   . Goiter   . HLD (hyperlipidemia)   . HTN (hypertension)   . Hypothyroid    Goiter  . Prediabetes     PAST SURGICAL HISTORY: Past Surgical History:  Procedure Laterality Date  .  COLONOSCOPY N/A 11/15/2015   Procedure: COLONOSCOPY;  Surgeon: West Bali, MD;  Location: [facility];  Service: Endoscopy;  Laterality: N/A;  9:30 Am  . FOOT SURGERY     pinkie toe  . TONSILLECTOMY      SOCIAL HISTORY: Social History   Tobacco Use  . Smoking status: Never Smoker  . Smokeless tobacco: Never Used  Substance Use Topics  . Alcohol use: Not Currently  . Drug use: No    FAMILY HISTORY: Family History  Problem Relation Age of Onset  . Hypertension Father   . Hyperlipidemia Father   . Colon cancer Maternal Grandmother   . Diabetes Paternal Grandmother   . Thyroid disease Mother     ROS: Review of Systems  Constitutional: Positive for weight loss.  Gastrointestinal: Negative for nausea and vomiting.  Endo/Heme/Allergies:       Negative hypoglycemia    PHYSICAL EXAM: Blood pressure 114/74, pulse 65, temperature 97.8 F (36.6 C), temperature source Oral, height 5\' 3"  (1.6 m), weight 154 lb (69.9 kg), SpO2 99 %. Body mass index is 27.28 kg/m. Physical Exam Vitals signs reviewed.  Constitutional:      Appearance: Normal appearance. She is obese.  Cardiovascular:     Rate and Rhythm: Normal rate.     Pulses: Normal pulses.  Pulmonary:     Effort: Pulmonary effort is normal.     Breath sounds: Normal breath sounds.  Musculoskeletal: Normal range of motion.  Skin:    General: Skin is warm and dry.  Neurological:     Mental Status: She is alert and oriented to person, place, and time.  Psychiatric:        Mood and Affect: Mood normal.        Behavior: Behavior normal.     RECENT LABS AND TESTS: BMET    Component Value Date/Time   NA 139 09/10/2018 1209   K 4.1 09/10/2018 1209   CL 100 09/10/2018 1209   CO2 25 09/10/2018 1209   GLUCOSE 99 09/10/2018 1209   GLUCOSE 96 12/09/2015 1131   BUN 12 09/10/2018 1209   CREATININE 0.78 09/10/2018 1209   CREATININE 0.79 12/09/2015 1131   CALCIUM 9.6 09/10/2018 1209   GFRNONAA 83 09/10/2018 1209    GFRAA 96 09/10/2018 1209   Lab Results  Component Value Date   HGBA1C 6.3 (H) 09/10/2018   HGBA1C 6.2 03/25/2018   HGBA1C 6.2 (H) 06/12/2017   Lab Results  Component Value Date   INSULIN 9.9 09/10/2018   CBC    Component Value Date/Time   WBC 5.5 09/10/2018 1209   WBC 6.3 12/09/2015 1131   RBC 5.14 09/10/2018 1209   RBC 5.11 (H) 12/09/2015 1131   HGB 14.4 09/10/2018 1209   HGB 13.6 12/09/2015 1042   HCT 43.9 09/10/2018 1209   PLT 215 07/05/2018 0942   MCV 85 09/10/2018 1209   MCH 28.0 09/10/2018 1209  MCH 28.6 12/09/2015 1131   MCHC 32.8 09/10/2018 1209   MCHC 33.2 12/09/2015 1131   RDW 13.6 09/10/2018 1209   LYMPHSABS 2.3 09/10/2018 1209   EOSABS 0.2 09/10/2018 1209   BASOSABS 0.1 09/10/2018 1209   Iron/TIBC/Ferritin/ %Sat    Component Value Date/Time   FERRITIN 15 04/30/2014 1711   Lipid Panel     Component Value Date/Time   CHOL 250 (H) 09/10/2018 1209   TRIG 127 09/10/2018 1209   HDL 65 09/10/2018 1209   CHOLHDL 3.8 07/05/2018 0942   CHOLHDL 3.8 12/09/2015 1131   VLDL 24 12/09/2015 1131   LDLCALC 160 (H) 09/10/2018 1209   Hepatic Function Panel     Component Value Date/Time   PROT 7.7 09/10/2018 1209   ALBUMIN 4.8 09/10/2018 1209   AST 19 09/10/2018 1209   ALT 25 09/10/2018 1209   ALKPHOS 65 09/10/2018 1209   BILITOT 1.0 09/10/2018 1209      Component Value Date/Time   TSH 1.410 09/10/2018 1209   TSH 0.50 03/25/2018 1357   TSH 0.151 (L) 06/12/2017 0923      OBESITY BEHAVIORAL INTERVENTION VISIT  Today's visit was # 4   Starting weight: 170 lbs Starting date: 09/10/2018 Today's weight : 154 lbs Today's date: 10/24/2018 Total lbs lost to date: 16    ASK: We discussed the diagnosis of obesity with Deborah Miranda today and Deborah Miranda agreed to give [place] permission to discuss obesity behavioral modification therapy today.  ASSESS: Deborah Miranda has the diagnosis of obesity and her BMI today is 27.29 Deborah Miranda is in the action stage of  change   ADVISE: Deborah Miranda was educated on the multiple health risks of obesity as well as the benefit of weight loss to improve her health. She was advised of the need for long term treatment and the importance of lifestyle modifications to improve her current health and to decrease her risk of future health problems.  AGREE: Multiple dietary modification options and treatment options were discussed and  Deborah Miranda agreed to follow the recommendations documented in the above note.  ARRANGE: Deborah Miranda was educated on the importance of frequent visits to treat obesity as outlined per CMS and USPSTF guidelines and agreed to schedule her next follow up appointment today.  I, Burt Knack, am acting as transcriptionist for Quillian Quince, MD  I have reviewed the above documentation for accuracy and completeness, and I agree with the above. -Quillian Quince, MD

## 2018-11-04 ENCOUNTER — Encounter (INDEPENDENT_AMBULATORY_CARE_PROVIDER_SITE_OTHER): Payer: Self-pay

## 2018-11-06 ENCOUNTER — Telehealth (INDEPENDENT_AMBULATORY_CARE_PROVIDER_SITE_OTHER): Payer: BC Managed Care – PPO | Admitting: Family Medicine

## 2018-11-06 ENCOUNTER — Encounter (INDEPENDENT_AMBULATORY_CARE_PROVIDER_SITE_OTHER): Payer: Self-pay | Admitting: Family Medicine

## 2018-11-06 ENCOUNTER — Other Ambulatory Visit: Payer: Self-pay

## 2018-11-06 DIAGNOSIS — E669 Obesity, unspecified: Secondary | ICD-10-CM | POA: Diagnosis not present

## 2018-11-06 DIAGNOSIS — Z6830 Body mass index (BMI) 30.0-30.9, adult: Secondary | ICD-10-CM

## 2018-11-06 DIAGNOSIS — E559 Vitamin D deficiency, unspecified: Secondary | ICD-10-CM

## 2018-11-06 MED ORDER — VITAMIN D (ERGOCALCIFEROL) 1.25 MG (50000 UNIT) PO CAPS: 50000.0000 [IU] | ORAL_CAPSULE | ORAL | 0 refills | Status: DC

## 2018-11-11 NOTE — Progress Notes (Signed)
Office: [phone]  /  Fax: [phone] TeleHealth Visit:  Deborah Miranda has verbally consented to this TeleHealth visit today. The patient is located at home, the provider is located at the [facility] office. The participants in this visit include the listed provider and patient and any and all parties involved. The visit was conducted today via FaceTime.  HPI:   Chief Complaint: OBESITY Deborah Miranda is here to discuss her progress with her obesity treatment plan. She is on the Pescatarian eating plan and is following her eating plan approximately 60 % of the time. She states she is walking 20 to 30 minutes 6 to 7 days per week. Deborah Miranda is doing well following her Pescatarian plan most of the time. She notes that hunger increased when she deviates more, especially with increased simple carbohydrates. We were unable to weigh the patient today for this TeleHealth visit. She feels as if she has lost weight since her last visit (weight not reported). She has lost 16 lbs since starting treatment with [place].  Vitamin D deficiency Deborah Miranda has a diagnosis of vitamin D deficiency. She is stable on vit D. Her last vitamin D level was at 38.8 and is not yet at goal. Deborah Miranda denies nausea, vomiting or muscle weakness.  ASSESSMENT AND PLAN:  Vitamin D deficiency - Plan: Vitamin D, Ergocalciferol, (DRISDOL) 1.25 MG (50000 UT) CAPS capsule  Class 1 obesity with serious comorbidity and body mass index (BMI) of 30.0 to 30.9 in adult, unspecified obesity type  PLAN:  Vitamin D Deficiency Deborah Miranda was informed that low vitamin D levels contributes to fatigue and are associated with obesity, breast, and colon cancer. Deborah Miranda agrees to continue to take prescription Vit D @50 ,000 IU every week #4 with no refills and she will follow up for routine testing of vitamin D, at least 2-3 times per year. She was informed of the risk of over-replacement of vitamin D and agrees to not increase her dose  unless she discusses this with first. Deborah Miranda agrees to follow up with our clinic in 2 weeks.  Obesity Deborah Miranda is currently in the action stage of change. As such, her goal is to continue with weight loss efforts She has agreed to follow the Pescatarian eating plan Deborah Miranda will continue walking 20 to 30 minutes, 6 to 7 days per week for weight loss and overall health benefits. We discussed the following Behavioral Modification Strategies today: increasing lean protein intake and keeping healthy foods in the home  Deborah Miranda has agreed to follow up with our clinic in 2 weeks. She was informed of the importance of frequent follow up visits to maximize her success with intensive lifestyle modifications for her multiple health conditions.  ALLERGIES: Allergies  Allergen Reactions  . Other     Hornet sting    MEDICATIONS: Current Outpatient Medications on File Prior to Visit  Medication Sig Dispense Refill  . Cobalamin Combinations (B12 FOLATE) 800-800 MCG CAPS Take 1 tablet by mouth daily.    Deborah Miranda levothyroxine (SYNTHROID, LEVOTHROID) 75 MCG tablet Take 1 tablet (75 mcg total) by mouth daily. (Patient taking differently: Take 75 mcg by mouth every other day. ) 90 tablet 3  . Melatonin 5 MG TABS Take 1 tablet by mouth at bedtime.    Deborah Miranda Kitchen SYNTHROID 88 MCG tablet Take 88 mcg daily (Patient taking differently: every other day. ) 90 tablet 3  . vitamin C (ASCORBIC ACID) 500 MG tablet Take 500 mg by mouth daily as needed.    Deborah Miranda Kitchen  Zinc 30 MG CAPS Take 1 capsule by mouth daily as needed.     No current facility-administered medications on file prior to visit.     PAST MEDICAL HISTORY: Past Medical History:  Diagnosis Date  . CFS (chronic fatigue syndrome)   . Fatigue   . Goiter   . HLD (hyperlipidemia)   . HTN (hypertension)   . Hypothyroid    Goiter  . Prediabetes     PAST SURGICAL HISTORY: Past Surgical History:  Procedure Laterality Date  . COLONOSCOPY N/A 11/15/2015   Procedure:  COLONOSCOPY;  Surgeon: Danie Binder, MD;  Location: [facility];  Service: Endoscopy;  Laterality: N/A;  9:30 Am  . FOOT SURGERY     pinkie toe  . TONSILLECTOMY      SOCIAL HISTORY: Social History   Tobacco Use  . Smoking status: Never Smoker  . Smokeless tobacco: Never Used  Substance Use Topics  . Alcohol use: Not Currently  . Drug use: No    FAMILY HISTORY: Family History  Problem Relation Age of Onset  . Hypertension Father   . Hyperlipidemia Father   . Colon cancer Maternal Grandmother   . Diabetes Paternal Grandmother   . Thyroid disease Mother     ROS: Review of Systems  Constitutional: Positive for weight loss.  Gastrointestinal: Negative for nausea and vomiting.  Musculoskeletal:       Negative for muscle weakness    PHYSICAL EXAM: Pt in no acute distress  RECENT LABS AND TESTS: BMET    Component Value Date/Time   NA 139 09/10/2018 1209   K 4.1 09/10/2018 1209   CL 100 09/10/2018 1209   CO2 25 09/10/2018 1209   GLUCOSE 99 09/10/2018 1209   GLUCOSE 96 12/09/2015 1131   BUN 12 09/10/2018 1209   CREATININE 0.78 09/10/2018 1209   CREATININE 0.79 12/09/2015 1131   CALCIUM 9.6 09/10/2018 1209   GFRNONAA 83 09/10/2018 1209   GFRAA 96 09/10/2018 1209   Lab Results  Component Value Date   HGBA1C 6.3 (H) 09/10/2018   HGBA1C 6.2 03/25/2018   HGBA1C 6.2 (H) 06/12/2017   Lab Results  Component Value Date   INSULIN 9.9 09/10/2018   CBC    Component Value Date/Time   WBC 5.5 09/10/2018 1209   WBC 6.3 12/09/2015 1131   RBC 5.14 09/10/2018 1209   RBC 5.11 (H) 12/09/2015 1131   HGB 14.4 09/10/2018 1209   HGB 13.6 12/09/2015 1042   HCT 43.9 09/10/2018 1209   PLT 215 07/05/2018 0942   MCV 85 09/10/2018 1209   MCH 28.0 09/10/2018 1209   MCH 28.6 12/09/2015 1131   MCHC 32.8 09/10/2018 1209   MCHC 33.2 12/09/2015 1131   RDW 13.6 09/10/2018 1209   LYMPHSABS 2.3 09/10/2018 1209   EOSABS 0.2 09/10/2018 1209   BASOSABS 0.1 09/10/2018 1209    Iron/TIBC/Ferritin/ %Sat    Component Value Date/Time   FERRITIN 15 04/30/2014 1711   Lipid Panel     Component Value Date/Time   CHOL 250 (H) 09/10/2018 1209   TRIG 127 09/10/2018 1209   HDL 65 09/10/2018 1209   CHOLHDL 3.8 07/05/2018 0942   CHOLHDL 3.8 12/09/2015 1131   VLDL 24 12/09/2015 1131   LDLCALC 160 (H) 09/10/2018 1209   Hepatic Function Panel     Component Value Date/Time   PROT 7.7 09/10/2018 1209   ALBUMIN 4.8 09/10/2018 1209   AST 19 09/10/2018 1209   ALT 25 09/10/2018 1209   ALKPHOS 65  09/10/2018 1209   BILITOT 1.0 09/10/2018 1209      Component Value Date/Time   TSH 1.410 09/10/2018 1209   TSH 0.50 03/25/2018 1357   TSH 0.151 (L) 06/12/2017 0923     Ref. Range 09/10/2018 12:09  Vitamin D, 25-Hydroxy Latest Ref Range: 30.0 - 100.0 ng/mL 38.8    I, [place] CraneJoanne Murray, am acting as Energy managertranscriptionist for Quillian Quincearen Beasley, MD I have reviewed the above documentation for accuracy and completeness, and I agree with the above. -Quillian Quincearen Beasley, MD

## 2018-11-21 ENCOUNTER — Ambulatory Visit (INDEPENDENT_AMBULATORY_CARE_PROVIDER_SITE_OTHER): Payer: BC Managed Care – PPO | Admitting: Family Medicine

## 2018-11-21 ENCOUNTER — Encounter (INDEPENDENT_AMBULATORY_CARE_PROVIDER_SITE_OTHER): Payer: Self-pay | Admitting: Family Medicine

## 2018-11-21 ENCOUNTER — Other Ambulatory Visit: Payer: Self-pay

## 2018-11-21 VITALS — BP 120/78 | HR 53 | Temp 97.9°F | Ht 63.0 in | Wt 150.0 lb

## 2018-11-21 DIAGNOSIS — E559 Vitamin D deficiency, unspecified: Secondary | ICD-10-CM

## 2018-11-21 DIAGNOSIS — Z9189 Other specified personal risk factors, not elsewhere classified: Secondary | ICD-10-CM | POA: Diagnosis not present

## 2018-11-21 DIAGNOSIS — E669 Obesity, unspecified: Secondary | ICD-10-CM | POA: Diagnosis not present

## 2018-11-21 DIAGNOSIS — Z6830 Body mass index (BMI) 30.0-30.9, adult: Secondary | ICD-10-CM

## 2018-11-21 DIAGNOSIS — R030 Elevated blood-pressure reading, without diagnosis of hypertension: Secondary | ICD-10-CM | POA: Diagnosis not present

## 2018-11-21 MED ORDER — VITAMIN D (ERGOCALCIFEROL) 1.25 MG (50000 UNIT) PO CAPS: 50000.0000 [IU] | ORAL_CAPSULE | ORAL | 0 refills | Status: DC

## 2018-11-27 NOTE — Progress Notes (Signed)
Office: [phone]  /  Fax: [phone]   HPI:   Chief Complaint: OBESITY Deborah Miranda is here to discuss her progress with her obesity treatment plan. She is on the Pescatarian eating plan and is following her eating plan approximately 75 % of the time. She states she is walking for 20-30 minutes 7 times per week. Deborah Miranda has done well with weight loss on her Pescatarian plan. She did some celebration eating while on vacation, but was still mindful.  Her weight is 150 lb (68 kg) today and has had a weight loss of 4 pounds over a period of 4 weeks since her last visit. She has lost 20 lbs since starting treatment with [place].  Vitamin D Deficiency Deborah Miranda has a diagnosis of vitamin D deficiency. She is currently taking prescription Vit D, but level is not yet at goal. She denies nausea, vomiting or muscle weakness.  At risk for osteopenia and osteoporosis Deborah Miranda is at higher risk of osteopenia and osteoporosis due to vitamin D deficiency.   Elevated Blood Pressure Deborah Miranda's blood pressure in right arm was elevated at 140/76, left arm was 120/78. She denies chest pain, headaches, or dizziness. Her blood pressure is normally within normal limits.  ASSESSMENT AND PLAN:  Vitamin D deficiency - Plan: Vitamin D, Ergocalciferol, (DRISDOL) 1.25 MG (50000 UT) CAPS capsule  Elevated blood pressure reading  At risk for osteoporosis  Class 1 obesity with serious comorbidity and body mass index (BMI) of 30.0 to 30.9 in adult, unspecified obesity type - Starting BMI greater then 30  PLAN:  Vitamin D Deficiency Deborah Miranda was informed that low vitamin D levels contributes to fatigue and are associated with obesity, breast, and colon cancer. Reeda agrees to continue taking prescription Vit D 50,000 IU every week #4 and we will refill for 1 month. She will follow up for routine testing of vitamin D, at least 2-3 times per year. She was informed of the risk of over-replacement of vitamin D and agrees to not  increase her dose unless she discusses this with [place] first. Deborah Miranda agrees to follow up with our clinic in 4 weeks.  At risk for osteopenia and osteoporosis Deborah Miranda was given extended (15 minutes) osteoporosis prevention counseling today. Deborah Miranda is at risk for osteopenia and osteoporsis due to her vitamin D deficiency. She was encouraged to take her vitamin D and follow her higher calcium diet and increase strengthening exercise to help strengthen her bones and decrease her risk of osteopenia and osteoporosis.  Elevated Blood Pressure We will recheck her blood pressure in 1 month to reassess. Deborah Miranda will continue her diet and exercise in the meanwhile. Deborah Miranda agrees to follow up with our clinic in 4 weeks.  Obesity Deborah Miranda is currently in the action stage of change. As such, her goal is to continue with weight loss efforts She has agreed to follow the Pescatarian eating plan Deborah Miranda has been instructed to work up to a goal of 150 minutes of combined cardio and strengthening exercise per week for weight loss and overall health benefits. We discussed the following Behavioral Modification Strategies today: increasing lean protein intake and better snacking choices   Deborah Miranda has agreed to follow up with our clinic in 4 weeks. She was informed of the importance of frequent follow up visits to maximize her success with intensive lifestyle modifications for her multiple health conditions.  ALLERGIES: Allergies  Allergen Reactions   Other     Hornet sting    MEDICATIONS: Current Outpatient Medications on File Prior  to Visit  Medication Sig Dispense Refill   Cobalamin Combinations (B12 FOLATE) 800-800 MCG CAPS Take 1 tablet by mouth daily.     levothyroxine (SYNTHROID, LEVOTHROID) 75 MCG tablet Take 1 tablet (75 mcg total) by mouth daily. (Patient taking differently: Take 75 mcg by mouth every other day. ) 90 tablet 3   Melatonin 5 MG TABS Take 1 tablet by mouth at bedtime.     SYNTHROID 88 MCG  tablet Take 88 mcg daily (Patient taking differently: every other day. ) 90 tablet 3   vitamin C (ASCORBIC ACID) 500 MG tablet Take 500 mg by mouth daily as needed.     Zinc 30 MG CAPS Take 1 capsule by mouth daily as needed.     No current facility-administered medications on file prior to visit.     PAST MEDICAL HISTORY: Past Medical History:  Diagnosis Date   CFS (chronic fatigue syndrome)    Fatigue    Goiter    HLD (hyperlipidemia)    HTN (hypertension)    Hypothyroid    Goiter   Prediabetes     PAST SURGICAL HISTORY: Past Surgical History:  Procedure Laterality Date   COLONOSCOPY N/A 11/15/2015   Procedure: COLONOSCOPY;  Surgeon: [name] L Fields, MD;  Location: [facility];  Service: Endoscopy;  Laterality: N/A;  9:30 Am   FOOT SURGERY     pinkie toe   TONSILLECTOMY      SOCIAL HISTORY: Social History   Tobacco Use   Smoking status: Never Smoker   Smokeless tobacco: Never Used  Substance Use Topics   Alcohol use: Not Currently   Drug use: No    FAMILY HISTORY: Family History  Problem Relation Age of Onset   Hypertension Father    Hyperlipidemia Father    Colon cancer Maternal Grandmother    Diabetes Paternal Grandmother    Thyroid disease Mother     ROS: Review of Systems  Constitutional: Positive for weight loss.  Cardiovascular: Negative for chest pain.  Gastrointestinal: Negative for nausea and vomiting.  Musculoskeletal:       Negative muscle weakness  Neurological: Negative for dizziness and headaches.    PHYSICAL EXAM: Blood pressure 120/78, pulse (!) 53, temperature 97.9 F (36.6 C), temperature source Oral, height 5\' 3"  (1.6 m), weight 150 lb (68 kg), SpO2 99 %. Body mass index is 26.57 kg/m. Physical Exam Vitals signs reviewed.  Constitutional:      Appearance: Normal appearance. She is obese.  Cardiovascular:     Rate and Rhythm: Normal rate.     Pulses: Normal pulses.  Pulmonary:     Effort: Pulmonary  effort is normal.     Breath sounds: Normal breath sounds.  Musculoskeletal: Normal range of motion.  Skin:    General: Skin is warm and dry.  Neurological:     Mental Status: She is alert and oriented to person, place, and time.  Psychiatric:        Mood and Affect: Mood normal.        Behavior: Behavior normal.     RECENT LABS AND TESTS: BMET    Component Value Date/Time   NA 139 09/10/2018 1209   K 4.1 09/10/2018 1209   CL 100 09/10/2018 1209   CO2 25 09/10/2018 1209   GLUCOSE 99 09/10/2018 1209   GLUCOSE 96 12/09/2015 1131   BUN 12 09/10/2018 1209   CREATININE 0.78 09/10/2018 1209   CREATININE 0.79 12/09/2015 1131   CALCIUM 9.6 09/10/2018 1209   GFRNONAA  83 09/10/2018 1209   GFRAA 96 09/10/2018 1209   Lab Results  Component Value Date   HGBA1C 6.3 (H) 09/10/2018   HGBA1C 6.2 03/25/2018   HGBA1C 6.2 (H) 06/12/2017   Lab Results  Component Value Date   INSULIN 9.9 09/10/2018   CBC    Component Value Date/Time   WBC 5.5 09/10/2018 1209   WBC 6.3 12/09/2015 1131   RBC 5.14 09/10/2018 1209   RBC 5.11 (H) 12/09/2015 1131   HGB 14.4 09/10/2018 1209   HGB 13.6 12/09/2015 1042   HCT 43.9 09/10/2018 1209   PLT 215 07/05/2018 0942   MCV 85 09/10/2018 1209   MCH 28.0 09/10/2018 1209   MCH 28.6 12/09/2015 1131   MCHC 32.8 09/10/2018 1209   MCHC 33.2 12/09/2015 1131   RDW 13.6 09/10/2018 1209   LYMPHSABS 2.3 09/10/2018 1209   EOSABS 0.2 09/10/2018 1209   BASOSABS 0.1 09/10/2018 1209   Iron/TIBC/Ferritin/ %Sat    Component Value Date/Time   FERRITIN 15 04/30/2014 1711   Lipid Panel     Component Value Date/Time   CHOL 250 (H) 09/10/2018 1209   TRIG 127 09/10/2018 1209   HDL 65 09/10/2018 1209   CHOLHDL 3.8 07/05/2018 0942   CHOLHDL 3.8 12/09/2015 1131   VLDL 24 12/09/2015 1131   LDLCALC 160 (H) 09/10/2018 1209   Hepatic Function Panel     Component Value Date/Time   PROT 7.7 09/10/2018 1209   ALBUMIN 4.8 09/10/2018 1209   AST 19 09/10/2018 1209     ALT 25 09/10/2018 1209   ALKPHOS 65 09/10/2018 1209   BILITOT 1.0 09/10/2018 1209      Component Value Date/Time   TSH 1.410 09/10/2018 1209   TSH 0.50 03/25/2018 1357   TSH 0.151 (L) 06/12/2017 0923      OBESITY BEHAVIORAL INTERVENTION VISIT  Today's visit was # 6   Starting weight: 170 lbs Starting date: 09/10/2018 Today's weight : 150 lbs Today's date: 11/21/2018 Total lbs lost to date: 20    ASK: We discussed the diagnosis of obesity with Deborah Miranda today and Deborah Miranda agreed to give [place] permission to discuss obesity behavioral modification therapy today.  ASSESS: Deborah Miranda has the diagnosis of obesity and her BMI today is 26.58 Deborah Miranda is in the action stage of change   ADVISE: Deborah Miranda was educated on the multiple health risks of obesity as well as the benefit of weight loss to improve her health. She was advised of the need for long term treatment and the importance of lifestyle modifications to improve her current health and to decrease her risk of future health problems.  AGREE: Multiple dietary modification options and treatment options were discussed and  Deborah Miranda agreed to follow the recommendations documented in the above note.  ARRANGE: Deborah Miranda was educated on the importance of frequent visits to treat obesity as outlined per CMS and USPSTF guidelines and agreed to schedule her next follow up appointment today.  I, Burt Knack, am acting as transcriptionist for Quillian Quince, MD I have reviewed the above documentation for accuracy and completeness, and I agree with the above. -Quillian Quince, MD

## 2018-12-17 ENCOUNTER — Encounter (INDEPENDENT_AMBULATORY_CARE_PROVIDER_SITE_OTHER): Payer: Self-pay

## 2018-12-19 ENCOUNTER — Ambulatory Visit (INDEPENDENT_AMBULATORY_CARE_PROVIDER_SITE_OTHER): Payer: BC Managed Care – PPO | Admitting: Family Medicine

## 2018-12-19 ENCOUNTER — Other Ambulatory Visit: Payer: Self-pay

## 2018-12-19 ENCOUNTER — Encounter (INDEPENDENT_AMBULATORY_CARE_PROVIDER_SITE_OTHER): Payer: Self-pay | Admitting: Family Medicine

## 2018-12-19 VITALS — BP 125/85 | HR 73 | Temp 98.3°F | Ht 63.0 in | Wt 147.0 lb

## 2018-12-19 DIAGNOSIS — E7849 Other hyperlipidemia: Secondary | ICD-10-CM | POA: Diagnosis not present

## 2018-12-19 DIAGNOSIS — Z9189 Other specified personal risk factors, not elsewhere classified: Secondary | ICD-10-CM | POA: Diagnosis not present

## 2018-12-19 DIAGNOSIS — R7303 Prediabetes: Secondary | ICD-10-CM | POA: Diagnosis not present

## 2018-12-19 DIAGNOSIS — E559 Vitamin D deficiency, unspecified: Secondary | ICD-10-CM

## 2018-12-19 DIAGNOSIS — Z6830 Body mass index (BMI) 30.0-30.9, adult: Secondary | ICD-10-CM

## 2018-12-19 DIAGNOSIS — E66811 Obesity, class 1: Secondary | ICD-10-CM

## 2018-12-19 DIAGNOSIS — E669 Obesity, unspecified: Secondary | ICD-10-CM

## 2018-12-19 MED ORDER — VITAMIN D (ERGOCALCIFEROL) 1.25 MG (50000 UNIT) PO CAPS: 50000.0000 [IU] | ORAL_CAPSULE | ORAL | 0 refills | Status: DC

## 2018-12-19 NOTE — Progress Notes (Signed)
Office: [phone]  /  Fax: [phone]   HPI:   Chief Complaint: OBESITY Deborah Miranda is here to discuss her progress with her obesity treatment plan. She is on the Pescatarian eating plan and is following her eating plan approximately 80% of the time. She states she is exercising 0 minutes 0 times per week. [name] continues to do well with weight loss. She is following her plan well but making some substitutions, but is still mindful of her choices. Her weight is 147 lb (66.7 kg) today and has had a weight loss of 3 pounds over a period of 4 weeks since her last visit. She has lost 23 lbs since starting treatment with us.  Pre-Diabetes Deborah Miranda has a diagnosis of prediabetes based on her elevated Hgb A1c and was informed this puts her at greater risk of developing diabetes. She is doing well with diet and weight loss and reports decreased polyphagia. She denies nausea or hypoglycemia. She is due for labs.  At risk for diabetes Deborah Miranda is at higher than average risk for developing diabetes due to her obesity. She currently denies polyuria or polydipsia.  Vitamin D deficiency [name] has a diagnosis of Vitamin D deficiency, which is not yet at goal. She is currently taking prescription Vit D and denies nausea, vomiting or muscle weakness. She is due for labs.  Hyperlipidemia Deborah Miranda has hyperlipidemia and has been trying to improve her cholesterol levels with intensive lifestyle modification including a low saturated fat diet, exercise and weight loss. She has elevated LDL and is working on her diet. She denies any chest pain.  ASSESSMENT AND PLAN:  Prediabetes - Plan: Comprehensive metabolic panel, Hemoglobin A1c, Insulin, random  Vitamin D deficiency - Plan: Vitamin D, Ergocalciferol, (DRISDOL) 1.25 MG (50000 UT) CAPS capsule, Vitamin D (25 hydroxy)  Other hyperlipidemia - Plan: Lipid Panel With LDL/HDL Ratio  At risk for diabetes mellitus  Class 1 obesity with serious comorbidity and  body mass index (BMI) of 30.0 to 30.9 in adult, unspecified obesity type - BMI greater than  30 at start of program   PLAN:  Pre-Diabetes Deborah Miranda will continue to work on weight loss, exercise, and decreasing simple carbohydrates in her diet to help decrease the risk of diabetes. We dicussed metformin including benefits and risks. She was informed that eating too many simple carbohydrates or too many calories at one sitting increases the likelihood of GI side effects. Deborah Miranda will have labs checked and follow-up as directed.  Diabetes risk counseling Deborah Miranda was given extended (15 minutes) diabetes prevention counseling today. She is 58 y.o. female and has risk factors for diabetes including obesity. We discussed intensive lifestyle modifications today with an emphasis on weight loss as well as increasing exercise and decreasing simple carbohydrates in her diet.  Vitamin D Deficiency Deborah Miranda was informed that low Vitamin D levels contributes to fatigue and are associated with obesity, breast, and colon cancer. She agrees to continue to take prescription Vit D @ 50,000 IU every week #4 with 0 refills and will follow-up for routine testing of Vitamin D, at least 2-3 times per year. She was informed of the risk of over-replacement of Vitamin D and agrees to not increase her dose unless she discusses this with us first. [name] agrees to follow-up with our clinic in 4 weeks.  Hyperlipidemia Deborah Miranda was informed of the American Heart Association Guidelines emphasizing intensive lifestyle modifications as the first line treatment for hyperlipidemia. We discussed many lifestyle modifications today in depth, and Deborah Miranda will continue  to work on decreasing saturated fats such as fatty red meat, butter and many fried foods. [name] will have labs checked and follow-up as directed. She will also increase vegetables and lean protein in her diet and continue to work on exercise and weight loss efforts.  Obesity Deborah Miranda  is currently in the action stage of change. As such, her goal is to continue with weight loss efforts. She has agreed to follow the Pescatarian eating plan. [name] has been instructed to work up to a goal of 150 minutes of combined cardio and strengthening exercise per week for weight loss and overall health benefits. We discussed the following Behavioral Modification Stratagies today: increasing lean protein intake, decreasing simple carbohydrates, and holiday eating strategies.  Deborah Miranda has agreed to follow-up with our clinic in 4 weeks. She was informed of the importance of frequent follow-up visits to maximize her success with intensive lifestyle modifications for her multiple health conditions.  ALLERGIES: Allergies  Allergen Reactions  . Other     Hornet sting    MEDICATIONS: Current Outpatient Medications on File Prior to Visit  Medication Sig Dispense Refill  . Cobalamin Combinations (B12 FOLATE) 800-800 MCG CAPS Take 1 tablet by mouth daily.    [name] levothyroxine (SYNTHROID, LEVOTHROID) 75 MCG tablet Take 1 tablet (75 mcg total) by mouth daily. (Patient taking differently: Take 75 mcg by mouth every other day. ) 90 tablet 3  . Melatonin 5 MG TABS Take 1 tablet by mouth at bedtime.    [name] SYNTHROID 88 MCG tablet Take 88 mcg daily (Patient taking differently: every other day. ) 90 tablet 3  . vitamin C (ASCORBIC ACID) 500 MG tablet Take 500 mg by mouth daily as needed.    . Zinc 30 MG CAPS Take 1 capsule by mouth daily as needed.     No current facility-administered medications on file prior to visit.     PAST MEDICAL HISTORY: Past Medical History:  Diagnosis Date  . CFS (chronic fatigue syndrome)   . Fatigue   . Goiter   . HLD (hyperlipidemia)   . HTN (hypertension)   . Hypothyroid    Goiter  . Prediabetes     PAST SURGICAL HISTORY: Past Surgical History:  Procedure Laterality Date  . COLONOSCOPY N/A 11/15/2015   Procedure: COLONOSCOPY;  Surgeon: Danie Binder, MD;   Location: [facility];  Service: Endoscopy;  Laterality: N/A;  9:30 Am  . FOOT SURGERY     pinkie toe  . TONSILLECTOMY      SOCIAL HISTORY: Social History   Tobacco Use  . Smoking status: Never Smoker  . Smokeless tobacco: Never Used  Substance Use Topics  . Alcohol use: Not Currently  . Drug use: No    FAMILY HISTORY: Family History  Problem Relation Age of Onset  . Hypertension Father   . Hyperlipidemia Father   . Colon cancer Maternal Grandmother   . Diabetes Paternal Grandmother   . Thyroid disease Mother    ROS: Review of Systems  Cardiovascular: Negative for chest pain.  Gastrointestinal: Negative for nausea and vomiting.  Musculoskeletal:       Negative for muscle weakness.  Endo/Heme/Allergies:       Negative for hypoglycemia. Positive for decreased polyphagia.   PHYSICAL EXAM: Blood pressure 125/85, pulse 73, temperature 98.3 F (36.8 C), temperature source Oral, height 5\' 3"  (1.6 m), weight 147 lb (66.7 kg), SpO2 100 %. Body mass index is 26.04 kg/m. Physical Exam Vitals signs reviewed.  Constitutional:  Appearance: Normal appearance. She is obese.  Cardiovascular:     Rate and Rhythm: Normal rate.     Pulses: Normal pulses.  Pulmonary:     Effort: Pulmonary effort is normal.     Breath sounds: Normal breath sounds.  Musculoskeletal: Normal range of motion.  Skin:    General: Skin is warm and dry.  Neurological:     Mental Status: She is alert and oriented to person, place, and time.  Psychiatric:        Behavior: Behavior normal.   RECENT LABS AND TESTS: BMET    Component Value Date/Time   NA 139 09/10/2018 1209   K 4.1 09/10/2018 1209   CL 100 09/10/2018 1209   CO2 25 09/10/2018 1209   GLUCOSE 99 09/10/2018 1209   GLUCOSE 96 12/09/2015 1131   BUN 12 09/10/2018 1209   CREATININE 0.78 09/10/2018 1209   CREATININE 0.79 12/09/2015 1131   CALCIUM 9.6 09/10/2018 1209   GFRNONAA 83 09/10/2018 1209   GFRAA 96 09/10/2018 1209   Lab  Results  Component Value Date   HGBA1C 6.3 (H) 09/10/2018   HGBA1C 6.2 03/25/2018   HGBA1C 6.2 (H) 06/12/2017   Lab Results  Component Value Date   INSULIN 9.9 09/10/2018   CBC    Component Value Date/Time   WBC 5.5 09/10/2018 1209   WBC 6.3 12/09/2015 1131   RBC 5.14 09/10/2018 1209   RBC 5.11 (H) 12/09/2015 1131   HGB 14.4 09/10/2018 1209   HGB 13.6 12/09/2015 1042   HCT 43.9 09/10/2018 1209   PLT 215 07/05/2018 0942   MCV 85 09/10/2018 1209   MCH 28.0 09/10/2018 1209   MCH 28.6 12/09/2015 1131   MCHC 32.8 09/10/2018 1209   MCHC 33.2 12/09/2015 1131   RDW 13.6 09/10/2018 1209   LYMPHSABS 2.3 09/10/2018 1209   EOSABS 0.2 09/10/2018 1209   BASOSABS 0.1 09/10/2018 1209   Iron/TIBC/Ferritin/ %Sat    Component Value Date/Time   FERRITIN 15 04/30/2014 1711   Lipid Panel     Component Value Date/Time   CHOL 250 (H) 09/10/2018 1209   TRIG 127 09/10/2018 1209   HDL 65 09/10/2018 1209   CHOLHDL 3.8 07/05/2018 0942   CHOLHDL 3.8 12/09/2015 1131   VLDL 24 12/09/2015 1131   LDLCALC 160 (H) 09/10/2018 1209   Hepatic Function Panel     Component Value Date/Time   PROT 7.7 09/10/2018 1209   ALBUMIN 4.8 09/10/2018 1209   AST 19 09/10/2018 1209   ALT 25 09/10/2018 1209   ALKPHOS 65 09/10/2018 1209   BILITOT 1.0 09/10/2018 1209      Component Value Date/Time   TSH 1.410 09/10/2018 1209   TSH 0.50 03/25/2018 1357   TSH 0.151 (L) 06/12/2017 0923   Results for LUKA, REISCH (MRN [id number]) as of 12/19/2018 10:03  Ref. Range 09/10/2018 12:09  Vitamin D, 25-Hydroxy Latest Ref Range: 30.0 - 100.0 ng/mL 38.8   OBESITY BEHAVIORAL INTERVENTION VISIT  Today's visit was #7  Starting weight: 170 lbs  Starting date: 09/10/2018 Today's weight: 147 lbs  Today's date: 12/19/2018 Total lbs lost to date: 23    12/19/2018  Height 5\' 3"  (1.6 m)  Weight 147 lb (66.7 kg)  BMI (Calculated) 26.05  BLOOD PRESSURE - SYSTOLIC 125  BLOOD PRESSURE - DIASTOLIC 85   Body  Fat % 31 %  Total Body Water (lbs) 64.2 lbs   ASK: We discussed the diagnosis of obesity with today and Lashonta agreed to  give [place] permission to discuss obesity behavioral modification therapy today.  ASSESS: Jessiah has the diagnosis of obesity and her BMI today is 26.0. Taeler is in the action stage of change.   ADVISE: Maegan was educated on the multiple health risks of obesity as well as the benefit of weight loss to improve her health. She was advised of the need for long term treatment and the importance of lifestyle modifications to improve her current health and to decrease her risk of future health problems.  AGREE: Multiple dietary modification options and treatment options were discussed and  Josilyn agreed to follow the recommendations documented in the above note.  ARRANGE: Kai was educated on the importance of frequent visits to treat obesity as outlined per CMS and USPSTF guidelines and agreed to schedule her next follow up appointment today.  I, Marianna Payment, am acting as Energy manager for Quillian Quince, MD  I have reviewed the above documentation for accuracy and completeness, and I agree with the above. -Quillian Quince, MD

## 2018-12-20 LAB — VITAMIN D 25 HYDROXY (VIT D DEFICIENCY, FRACTURES): Vit D, 25-Hydroxy: 70.9 ng/mL (ref 30.0–100.0)

## 2018-12-20 LAB — INSULIN, RANDOM: INSULIN: 10 u[IU]/mL (ref 2.6–24.9)

## 2018-12-20 LAB — LIPID PANEL WITH LDL/HDL RATIO
Cholesterol, Total: 204 mg/dL — ABNORMAL HIGH (ref 100–199)
HDL: 52 mg/dL (ref >39)
LDL Chol Calc (NIH): 134 mg/dL — ABNORMAL HIGH (ref 0–99)
LDL/HDL Ratio: 2.6 ratio (ref 0.0–3.2)
Triglycerides: 98 mg/dL (ref 0–149)
VLDL Cholesterol Cal: 18 mg/dL (ref 5–40)

## 2018-12-20 LAB — COMPREHENSIVE METABOLIC PANEL
ALT: 10 IU/L (ref 0–32)
AST: 16 IU/L (ref 0–40)
Albumin/Globulin Ratio: 1.7 (ref 1.2–2.2)
Albumin: 4.6 g/dL (ref 3.8–4.9)
Alkaline Phosphatase: 74 IU/L (ref 39–117)
BUN/Creatinine Ratio: 24 (ref 12–28)
BUN: 18 mg/dL (ref 8–27)
Bilirubin Total: 0.8 mg/dL (ref 0.0–1.2)
CO2: 26 mmol/L (ref 20–29)
Calcium: 9.6 mg/dL (ref 8.7–10.3)
Chloride: 101 mmol/L (ref 96–106)
Creatinine, Ser: 0.75 mg/dL (ref 0.57–1.00)
GFR calc Af Amer: 100 mL/min/{1.73_m2} (ref >59)
GFR calc non Af Amer: 87 mL/min/{1.73_m2} (ref >59)
Globulin, Total: 2.7 g/dL (ref 1.5–4.5)
Glucose: 114 mg/dL — ABNORMAL HIGH (ref 65–99)
Potassium: 4 mmol/L (ref 3.5–5.2)
Sodium: 141 mmol/L (ref 134–144)
Total Protein: 7.3 g/dL (ref 6.0–8.5)

## 2018-12-20 LAB — HEMOGLOBIN A1C
Est. average glucose Bld gHb Est-mCnc: 123 mg/dL
Hgb A1c MFr Bld: 5.9 % — ABNORMAL HIGH (ref 4.8–5.6)

## 2019-01-15 ENCOUNTER — Encounter (INDEPENDENT_AMBULATORY_CARE_PROVIDER_SITE_OTHER): Payer: Self-pay | Admitting: Family Medicine

## 2019-01-15 ENCOUNTER — Telehealth (INDEPENDENT_AMBULATORY_CARE_PROVIDER_SITE_OTHER): Payer: BC Managed Care – PPO | Admitting: Family Medicine

## 2019-01-15 ENCOUNTER — Other Ambulatory Visit: Payer: Self-pay

## 2019-01-15 DIAGNOSIS — E669 Obesity, unspecified: Secondary | ICD-10-CM | POA: Diagnosis not present

## 2019-01-15 DIAGNOSIS — R7303 Prediabetes: Secondary | ICD-10-CM

## 2019-01-15 DIAGNOSIS — Z6830 Body mass index (BMI) 30.0-30.9, adult: Secondary | ICD-10-CM | POA: Diagnosis not present

## 2019-01-15 DIAGNOSIS — E559 Vitamin D deficiency, unspecified: Secondary | ICD-10-CM | POA: Diagnosis not present

## 2019-01-15 MED ORDER — VITAMIN D (ERGOCALCIFEROL) 1.25 MG (50000 UNIT) PO CAPS: 50000.0000 [IU] | ORAL_CAPSULE | ORAL | 0 refills | Status: DC

## 2019-01-15 NOTE — Progress Notes (Signed)
Office: [phone]  /  Fax: [phone] TeleHealth Visit:  Deborah Miranda has verbally consented to this TeleHealth visit today. The patient is located at work, the provider is located at the [facility] office. The participants in this visit include the listed provider and patient. The visit was conducted today via FaceTime.  HPI:   Chief Complaint: OBESITY Deborah Miranda is here to discuss her progress with her obesity treatment plan. She is on the Pescatarian eating plan and is following her eating plan approximately 50% of the time. She states she is exercising 0 minutes 0 times per week. Lockie feels she has done well with weight loss and has lost 2-3 lbs since her last visit even over Thanksgiving. Hunger is controlled and she is not feeling deprived. She is deviating a bit, but is still being mindful. We were unable to weigh the patient today for this TeleHealth visit. She feels as if she has lost 2-3 lbs since her last visit. She has lost 23 lbs since starting treatment with [place].  Vitamin D deficiency Deborah Miranda has a diagnosis of Vitamin D deficiency. We discussed lab results with Misti and her Vitamin D is greatly improved and is now at goal. She is currently taking prescription Vit D and denies nausea, vomiting or muscle weakness.  Pre-Diabetes Deborah Miranda has a diagnosis of prediabetes based on her elevated Hgb A1c and was informed this puts her at greater risk of developing diabetes. We discussed lab results with Deborah Miranda. A1c is improved with diet and has decreased from 6.3 to 5.9. She is doing well on her diet prescription and notes decreased polyphagia. She denies nausea or hypoglycemia.  ASSESSMENT AND PLAN:  Vitamin D deficiency - Plan: Vitamin D, Ergocalciferol, (DRISDOL) 1.25 MG (50000 UT) CAPS capsule  Prediabetes  Class 1 obesity with serious comorbidity and body mass index (BMI) of 30.0 to 30.9 in adult, unspecified obesity type - Starting BMI greater then  30  PLAN:  Vitamin D Deficiency Deborah Miranda was informed that low Vitamin D levels contributes to fatigue and are associated with obesity, breast, and colon cancer. She agrees to continue to take prescription Vit D @ 50,000 IU #4 with 0 refills. She will change her dose to 1 pill every 2 weeks and will follow-up for routine testing of Vitamin D at least 2-3 times per year. She was informed of the risk of over-replacement of Vitamin D and agrees to not increase her dose unless she discusses this with [place] first. Deborah Miranda agrees to follow-up with our clinic in 3-4 weeks.  Pre-Diabetes Kimm will continue to work on weight loss, exercise, and decreasing simple carbohydrates to help decrease the risk of diabetes. Will continue to monitor.  Obesity Deborah Miranda is currently in the action stage of change. As such, her goal is to continue with weight loss efforts. She has agreed to follow the Pescatarian eating plan. Deborah Miranda has been instructed to work up to a goal of 150 minutes of combined cardio and strengthening exercise per week for weight loss and overall health benefits. We discussed the following Behavioral Modification Strategies today: increasing lean protein intake and holiday eating strategies.  Deborah Miranda has agreed to follow-up with our clinic in 3-4 weeks. She was informed of the importance of frequent follow-up visits to maximize her success with intensive lifestyle modifications for her multiple health conditions.  ALLERGIES: Allergies  Allergen Reactions   Other     Hornet sting    MEDICATIONS: Current Outpatient Medications on File Prior to Visit  Medication Sig Dispense Refill   Cobalamin Combinations (B12 FOLATE) 800-800 MCG CAPS Take 1 tablet by mouth daily.     levothyroxine (SYNTHROID, LEVOTHROID) 75 MCG tablet Take 1 tablet (75 mcg total) by mouth daily. (Patient taking differently: Take 75 mcg by mouth every other day. ) 90 tablet 3   Melatonin 5 MG TABS Take 1 tablet by mouth at  bedtime.     SYNTHROID 88 MCG tablet Take 88 mcg daily (Patient taking differently: every other day. ) 90 tablet 3   vitamin C (ASCORBIC ACID) 500 MG tablet Take 500 mg by mouth daily as needed.     Zinc 30 MG CAPS Take 1 capsule by mouth daily as needed.     No current facility-administered medications on file prior to visit.     PAST MEDICAL HISTORY: Past Medical History:  Diagnosis Date   CFS (chronic fatigue syndrome)    Fatigue    Goiter    HLD (hyperlipidemia)    HTN (hypertension)    Hypothyroid    Goiter   Prediabetes     PAST SURGICAL HISTORY: Past Surgical History:  Procedure Laterality Date   COLONOSCOPY N/A 11/15/2015   Procedure: COLONOSCOPY;  Surgeon: [name] L Fields, MD;  Location: [facility];  Service: Endoscopy;  Laterality: N/A;  9:30 Am   FOOT SURGERY     pinkie toe   TONSILLECTOMY      SOCIAL HISTORY: Social History   Tobacco Use   Smoking status: Never Smoker   Smokeless tobacco: Never Used  Substance Use Topics   Alcohol use: Not Currently   Drug use: No    FAMILY HISTORY: Family History  Problem Relation Age of Onset   Hypertension Father    Hyperlipidemia Father    Colon cancer Maternal Grandmother    Diabetes Paternal Grandmother    Thyroid disease Mother    ROS: Review of Systems  Gastrointestinal: Negative for nausea and vomiting.  Musculoskeletal:       Negative for muscle weakness.  Endo/Heme/Allergies:       Negative for hypoglycemia. Positive for decreased polyphagia.   PHYSICAL EXAM: Pt in no acute distress  RECENT LABS AND TESTS: BMET    Component Value Date/Time   NA 141 12/19/2018 0829   K 4.0 12/19/2018 0829   CL 101 12/19/2018 0829   CO2 26 12/19/2018 0829   GLUCOSE 114 (H) 12/19/2018 0829   GLUCOSE 96 12/09/2015 1131   BUN 18 12/19/2018 0829   CREATININE 0.75 12/19/2018 0829   CREATININE 0.79 12/09/2015 1131   CALCIUM 9.6 12/19/2018 0829   GFRNONAA 87 12/19/2018 0829   GFRAA 100  12/19/2018 0829   Lab Results  Component Value Date   HGBA1C 5.9 (H) 12/19/2018   HGBA1C 6.3 (H) 09/10/2018   HGBA1C 6.2 03/25/2018   HGBA1C 6.2 (H) 06/12/2017   Lab Results  Component Value Date   INSULIN 10.0 12/19/2018   INSULIN 9.9 09/10/2018   CBC    Component Value Date/Time   WBC 5.5 09/10/2018 1209   WBC 6.3 12/09/2015 1131   RBC 5.14 09/10/2018 1209   RBC 5.11 (H) 12/09/2015 1131   HGB 14.4 09/10/2018 1209   HGB 13.6 12/09/2015 1042   HCT 43.9 09/10/2018 1209   PLT 215 07/05/2018 0942   MCV 85 09/10/2018 1209   MCH 28.0 09/10/2018 1209   MCH 28.6 12/09/2015 1131   MCHC 32.8 09/10/2018 1209   MCHC 33.2 12/09/2015 1131   RDW 13.6 09/10/2018 1209  LYMPHSABS 2.3 09/10/2018 1209   EOSABS 0.2 09/10/2018 1209   BASOSABS 0.1 09/10/2018 1209   Iron/TIBC/Ferritin/ %Sat    Component Value Date/Time   FERRITIN 15 04/30/2014 1711   Lipid Panel     Component Value Date/Time   CHOL 204 (H) 12/19/2018 0829   TRIG 98 12/19/2018 0829   HDL 52 12/19/2018 0829   CHOLHDL 3.8 07/05/2018 0942   CHOLHDL 3.8 12/09/2015 1131   VLDL 24 12/09/2015 1131   LDLCALC 134 (H) 12/19/2018 0829   Hepatic Function Panel     Component Value Date/Time   PROT 7.3 12/19/2018 0829   ALBUMIN 4.6 12/19/2018 0829   AST 16 12/19/2018 0829   ALT 10 12/19/2018 0829   ALKPHOS 74 12/19/2018 0829   BILITOT 0.8 12/19/2018 0829      Component Value Date/Time   TSH 1.410 09/10/2018 1209   TSH 0.50 03/25/2018 1357   TSH 0.151 (L) 06/12/2017 0923   Results for AYJAH, SHOW (MRN [id number]) as of 01/15/2019 14:09  Ref. Range 12/19/2018 08:29  Vitamin D, 25-Hydroxy Latest Ref Range: 30.0 - 100.0 ng/mL 70.9   I, Michaelene Song, am acting as Location manager for Dennard Nip, MD I have reviewed the above documentation for accuracy and completeness, and I agree with the above. -Dennard Nip, MD

## 2019-01-16 ENCOUNTER — Ambulatory Visit (INDEPENDENT_AMBULATORY_CARE_PROVIDER_SITE_OTHER): Payer: BC Managed Care – PPO | Admitting: Bariatrics

## 2019-02-12 ENCOUNTER — Encounter (INDEPENDENT_AMBULATORY_CARE_PROVIDER_SITE_OTHER): Payer: Self-pay | Admitting: Family Medicine

## 2019-02-12 ENCOUNTER — Other Ambulatory Visit: Payer: Self-pay

## 2019-02-12 ENCOUNTER — Telehealth (INDEPENDENT_AMBULATORY_CARE_PROVIDER_SITE_OTHER): Payer: BC Managed Care – PPO | Admitting: Family Medicine

## 2019-02-12 DIAGNOSIS — Z9189 Other specified personal risk factors, not elsewhere classified: Secondary | ICD-10-CM

## 2019-02-12 DIAGNOSIS — R7303 Prediabetes: Secondary | ICD-10-CM

## 2019-02-12 DIAGNOSIS — Z6830 Body mass index (BMI) 30.0-30.9, adult: Secondary | ICD-10-CM

## 2019-02-12 DIAGNOSIS — E669 Obesity, unspecified: Secondary | ICD-10-CM

## 2019-02-12 DIAGNOSIS — E559 Vitamin D deficiency, unspecified: Secondary | ICD-10-CM

## 2019-02-12 MED ORDER — METFORMIN HCL 500 MG PO TABS: 500.0000 mg | ORAL_TABLET | Freq: Every day | ORAL | 0 refills | Status: DC

## 2019-02-12 MED ORDER — VITAMIN D (ERGOCALCIFEROL) 1.25 MG (50000 UNIT) PO CAPS: 50000.0000 [IU] | ORAL_CAPSULE | ORAL | 0 refills | Status: DC

## 2019-02-14 NOTE — Progress Notes (Signed)
TeleHealth Visit:  Due to the COVID-19 pandemic, this visit was completed with telemedicine (audio/video) technology to reduce patient and provider exposure as well as to preserve personal protective equipment.   Deborah Miranda has verbally consented to this TeleHealth visit. The patient is located at home, the provider is located at the [facility] office. The participants in this visit include the listed provider and patient. The visit was conducted today via face time.   Chief Complaint: OBESITY Deborah Miranda is here to discuss her progress with her obesity treatment plan along with follow-up of her obesity related diagnoses. Deborah Miranda is on the [employer] and states she is following her eating plan approximately 30% of the time. Deborah Miranda states she is walking for 30 minutes 4-5 times per week.  Today's visit was #: 9 Starting weight: 170 lbs Starting date: 09/10/2018  Interim History: Deborah Miranda did some celebration eating over the holidays. She feels she maintained her weight overall, but she is ready to get back on track with her plan.  Subjective:   1. Pre-diabetes Deborah Miranda's A1c has improved but she has struggled a bit more with her diet, and as recently increased simple carbohydrates. She is ready to get back on track.  2. Vitamin D deficiency Deborah Miranda's Vit D level is now at goal. She denies nausea, vomiting, or muscle weakness.  3. At risk for diabetes mellitus Deborah Miranda is at higher than average risk for developing diabetes due to her obesity.   Assessment/Plan:   1. Pre-diabetes Deborah Miranda agrees to start metFORMIN (GLUCOPHAGE) 500 MG tablet; Take 1 tablet (500 mg total) by mouth daily with breakfast. Dispense: 30 tablet; Refill: 0. We will continue to monitor her progress.  2. Vitamin D deficiency We will refill prescription Vit D for 1 month- Vitamin D, Ergocalciferol, (DRISDOL) 1.25 MG (50000 UT) CAPS capsule; Take 1 capsule (50,000 Units  total) by mouth every 7 (seven) days.  Dispense: 4 capsule; Refill: 0. We will continue to monitor.  3. At risk for diabetes mellitus Deborah Miranda is a 59 y.o. female and has risk factors for diabetes including obesity. We discussed intensive lifestyle modifications today with an emphasis on weight loss as well as increasing exercise and decreasing simple carbohydrates in her diet. (15 minutes)  4. Class 1 obesity with serious comorbidity and body mass index (BMI) of 30.0 to 30.9 in adult, unspecified obesity type [name] is currently in the action stage of change. As such, her goal is to continue with weight loss efforts. She has agreed to on the [employer].   We discussed the following exercise goals today: Deborah Miranda will continue her current exercise as is.  We discussed the following behavioral modification strategies today: decreasing simple carbohydrates, no skipping meals and meal planning and cooking strategies.  Deborah Miranda has agreed to follow-up with our clinic in 5 weeks. She was informed of the importance of frequent follow-up visits to maximize her success with intensive lifestyle modifications for her multiple health conditions.  Objective:   VITALS: Per patient if applicable, see vitals. GENERAL: Alert and in no acute distress. CARDIOPULMONARY: No increased WOB. Speaking in clear sentences.  PSYCH: Pleasant and cooperative. Speech normal rate and rhythm. Affect is appropriate. Insight and judgement are appropriate. Attention is focused, linear, and appropriate.  NEURO: Oriented as arrived to appointment on time with no prompting.   Lab Results  Component Value Date   CREATININE 0.75 12/19/2018   BUN 18 12/19/2018   NA 141 12/19/2018   K  4.0 12/19/2018   CL 101 12/19/2018   CO2 26 12/19/2018   Lab Results  Component Value Date   ALT 10 12/19/2018   AST 16 12/19/2018   ALKPHOS 74 12/19/2018   BILITOT 0.8 12/19/2018   Lab Results  Component Value Date    HGBA1C 5.9 (H) 12/19/2018   HGBA1C 6.3 (H) 09/10/2018   HGBA1C 6.2 03/25/2018   HGBA1C 6.2 (H) 06/12/2017   Lab Results  Component Value Date   INSULIN 10.0 12/19/2018   INSULIN 9.9 09/10/2018   Lab Results  Component Value Date   TSH 1.410 09/10/2018   Lab Results  Component Value Date   CHOL 204 (H) 12/19/2018   HDL 52 12/19/2018   LDLCALC 134 (H) 12/19/2018   TRIG 98 12/19/2018   CHOLHDL 3.8 07/05/2018   Lab Results  Component Value Date   WBC 5.5 09/10/2018   HGB 14.4 09/10/2018   HCT 43.9 09/10/2018   MCV 85 09/10/2018   PLT 215 07/05/2018   Lab Results  Component Value Date   FERRITIN 15 04/30/2014    Attestation Statements:   Reviewed by clinician on day of visit: allergies, medications, problem list, medical history, surgical history, family history, social history and previous encounter notes.  I, Trixie Dredge, am acting as transcriptionist for Dennard Nip, MD.  I have reviewed the above documentation for accuracy and completeness, and I agree with the above. - Dennard Nip, MD

## 2019-02-24 ENCOUNTER — Other Ambulatory Visit (INDEPENDENT_AMBULATORY_CARE_PROVIDER_SITE_OTHER): Payer: Self-pay | Admitting: Family Medicine

## 2019-02-24 DIAGNOSIS — E559 Vitamin D deficiency, unspecified: Secondary | ICD-10-CM

## 2019-03-12 ENCOUNTER — Other Ambulatory Visit (INDEPENDENT_AMBULATORY_CARE_PROVIDER_SITE_OTHER): Payer: Self-pay | Admitting: Family Medicine

## 2019-03-12 DIAGNOSIS — R7303 Prediabetes: Secondary | ICD-10-CM

## 2019-03-15 ENCOUNTER — Other Ambulatory Visit (INDEPENDENT_AMBULATORY_CARE_PROVIDER_SITE_OTHER): Payer: Self-pay | Admitting: Family Medicine

## 2019-03-15 DIAGNOSIS — E559 Vitamin D deficiency, unspecified: Secondary | ICD-10-CM

## 2019-03-24 ENCOUNTER — Other Ambulatory Visit: Payer: Self-pay

## 2019-03-24 ENCOUNTER — Encounter (INDEPENDENT_AMBULATORY_CARE_PROVIDER_SITE_OTHER): Payer: Self-pay | Admitting: Family Medicine

## 2019-03-24 ENCOUNTER — Ambulatory Visit (INDEPENDENT_AMBULATORY_CARE_PROVIDER_SITE_OTHER): Payer: BC Managed Care – PPO | Admitting: Family Medicine

## 2019-03-24 VITALS — BP 125/80 | HR 69 | Temp 97.8°F | Ht 63.0 in | Wt 148.0 lb

## 2019-03-24 DIAGNOSIS — E669 Obesity, unspecified: Secondary | ICD-10-CM | POA: Diagnosis not present

## 2019-03-24 DIAGNOSIS — Z6830 Body mass index (BMI) 30.0-30.9, adult: Secondary | ICD-10-CM

## 2019-03-24 DIAGNOSIS — E8881 Metabolic syndrome: Secondary | ICD-10-CM | POA: Diagnosis not present

## 2019-03-24 MED ORDER — METFORMIN HCL 500 MG PO TABS: 500.0000 mg | ORAL_TABLET | Freq: Every day | ORAL | 1 refills | Status: DC

## 2019-03-24 NOTE — Progress Notes (Signed)
Chief Complaint:   OBESITY Deborah Miranda is here to discuss her progress with her obesity treatment plan along with follow-up of her obesity related diagnoses. Deborah Miranda is on the [employer] and states she is following her eating plan approximately 50% of the time. Deborah Miranda states she is exercising 0 minutes 0 times per week.  Today's visit was #: 10 Starting weight: 170 lbs Starting date: 09/10/2018 Today's weight: 148 lbs Today's date: 03/24/2019 Total lbs lost to date: 22 Total lbs lost since last in-office visit: 0  Interim History: Deborah Miranda has done well maintaining her weight over the holidays. She wants to lose more weight. She notes increased family sabotage and increased temptations. She is disappointed, but she is ready to get back on track.  Subjective:   Insulin resistance  Deborah Miranda started metformin, but her friend told her it caused renal damage, so she stopped. She notes that polyphagia has returned. She continues to work on diet and exercise to decrease her risk of diabetes.  Lab Results  Component Value Date   INSULIN 10.0 12/19/2018   INSULIN 9.9 09/10/2018   Lab Results  Component Value Date   HGBA1C 5.9 (H) 12/19/2018   Assessment/Plan:   Insulin resistance  Deborah Miranda agrees to restart metformin and a prescription for a 2 month refill of metformin was written today. Patient was reassured that metformin would not cause renal damage. Deborah Miranda will continue to work on weight loss, exercise, and decreasing simple carbohydrates to help decrease the risk of diabetes. Deborah Miranda agreed to follow-up with [place] as directed to closely monitor her progress.  Class 1 obesity with serious comorbidity and body mass index (BMI) of 30.0 to 30.9 in adult, unspecified obesity type - Starting BMI greater then 30 Deborah Miranda is currently in the action stage of change. As such, her goal is to continue with weight loss efforts. She has agreed to the [employer].   Behavioral  modification strategies: meal planning and cooking strategies and dealing with family or coworker sabotage.  Deborah Miranda has agreed to follow-up with our clinic in 8 weeks. She was informed of the importance of frequent follow-up visits to maximize her success with intensive lifestyle modifications for her multiple health conditions.   Objective:   Blood pressure 125/80, pulse 69, temperature 97.8 F (36.6 C), temperature source Oral, height 5\' 3"  (1.6 m), weight 148 lb (67.1 kg), SpO2 98 %. Body mass index is 26.22 kg/m.  General: Cooperative, alert, well developed, in no acute distress. HEENT: Conjunctivae and lids unremarkable. Cardiovascular: Regular rhythm.  Lungs: Normal work of breathing. Neurologic: No focal deficits.   Lab Results  Component Value Date   CREATININE 0.75 12/19/2018   BUN 18 12/19/2018   NA 141 12/19/2018   K 4.0 12/19/2018   CL 101 12/19/2018   CO2 26 12/19/2018   Lab Results  Component Value Date   ALT 10 12/19/2018   AST 16 12/19/2018   ALKPHOS 74 12/19/2018   BILITOT 0.8 12/19/2018   Lab Results  Component Value Date   HGBA1C 5.9 (H) 12/19/2018   HGBA1C 6.3 (H) 09/10/2018   HGBA1C 6.2 03/25/2018   HGBA1C 6.2 (H) 06/12/2017   Lab Results  Component Value Date   INSULIN 10.0 12/19/2018   INSULIN 9.9 09/10/2018   Lab Results  Component Value Date   TSH 1.410 09/10/2018   Lab Results  Component Value Date   CHOL 204 (H) 12/19/2018   HDL 52 12/19/2018   LDLCALC 134 (H) 12/19/2018  TRIG 98 12/19/2018   CHOLHDL 3.8 07/05/2018   Lab Results  Component Value Date   WBC 5.5 09/10/2018   HGB 14.4 09/10/2018   HCT 43.9 09/10/2018   MCV 85 09/10/2018   PLT 215 07/05/2018   Lab Results  Component Value Date   FERRITIN 15 04/30/2014    Ref. Range 12/19/2018 08:29  Vitamin D, 25-Hydroxy Latest Ref Range: 30.0 - 100.0 ng/mL 70.9   Attestation Statements:   Reviewed by clinician on day of visit: allergies, medications, problem list,  medical history, surgical history, family history, social history, and previous encounter notes.  30 minutes was spent on visit including pre visit chart review and post visit care and documentation.   I, Doreene Nest, am acting as transcriptionist for Dennard Nip, MD.  I have reviewed the above documentation for accuracy and completeness, and I agree with the above. -  Dennard Nip, MD

## 2019-04-06 ENCOUNTER — Other Ambulatory Visit (INDEPENDENT_AMBULATORY_CARE_PROVIDER_SITE_OTHER): Payer: Self-pay | Admitting: Family Medicine

## 2019-04-06 DIAGNOSIS — E559 Vitamin D deficiency, unspecified: Secondary | ICD-10-CM

## 2019-04-08 ENCOUNTER — Encounter (INDEPENDENT_AMBULATORY_CARE_PROVIDER_SITE_OTHER): Payer: Self-pay

## 2019-04-09 ENCOUNTER — Encounter (INDEPENDENT_AMBULATORY_CARE_PROVIDER_SITE_OTHER): Payer: Self-pay | Admitting: Family Medicine

## 2019-04-10 ENCOUNTER — Encounter (INDEPENDENT_AMBULATORY_CARE_PROVIDER_SITE_OTHER): Payer: Self-pay

## 2019-04-10 NOTE — Telephone Encounter (Signed)
Please advsie

## 2019-04-10 NOTE — Telephone Encounter (Signed)
This has been sent to pt

## 2019-04-19 ENCOUNTER — Other Ambulatory Visit (INDEPENDENT_AMBULATORY_CARE_PROVIDER_SITE_OTHER): Payer: Self-pay | Admitting: Family Medicine

## 2019-04-19 DIAGNOSIS — E8881 Metabolic syndrome: Secondary | ICD-10-CM

## 2019-04-30 ENCOUNTER — Other Ambulatory Visit (INDEPENDENT_AMBULATORY_CARE_PROVIDER_SITE_OTHER): Payer: Self-pay | Admitting: Family Medicine

## 2019-04-30 DIAGNOSIS — E559 Vitamin D deficiency, unspecified: Secondary | ICD-10-CM

## 2019-05-19 ENCOUNTER — Ambulatory Visit (INDEPENDENT_AMBULATORY_CARE_PROVIDER_SITE_OTHER): Payer: BC Managed Care – PPO | Admitting: Family Medicine

## 2019-05-28 ENCOUNTER — Ambulatory Visit (INDEPENDENT_AMBULATORY_CARE_PROVIDER_SITE_OTHER): Payer: BC Managed Care – PPO | Admitting: Family Medicine

## 2019-06-05 ENCOUNTER — Ambulatory Visit (INDEPENDENT_AMBULATORY_CARE_PROVIDER_SITE_OTHER): Payer: BC Managed Care – PPO | Admitting: Family Medicine

## 2019-06-28 ENCOUNTER — Other Ambulatory Visit (INDEPENDENT_AMBULATORY_CARE_PROVIDER_SITE_OTHER): Payer: Self-pay | Admitting: Family Medicine

## 2019-06-28 DIAGNOSIS — E559 Vitamin D deficiency, unspecified: Secondary | ICD-10-CM

## 2019-07-08 NOTE — Progress Notes (Signed)
59 y.o. G35P2002 Married Other or two or more races female here for annual exam.  Did not get vaccinated.  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    Denies vaginal bleeding.  LMP 03/09/2017.    Has lost weight with Dr. Dalbert Garnet.  HbA1C was 5.9 down from 6.3.  Cholesterol decreased from 250  No LMP recorded. (Menstrual status: Perimenopausal).          Sexually active: Yes.    The current method of family planning is none.    Exercising: Yes.    walking Smoker:  no  Health Maintenance: Pap:  12-09-15 neg HPV HR neg, 07-05-2018 neg History of abnormal Pap:  no MMG:  07-05-2018 category c density birads 1:neg  Colonoscopy: 11-15-15 neg f/u 57yrs BMD:  none TDaP: 2019 Pneumonia vaccine(s):  no  Shingrix:   declines Hep C testing: neg 2017 Screening Labs: planning to do with Dr. Dalbert Garnet   reports that she has never smoked. She has never used smokeless tobacco. She reports previous alcohol use. She reports that she does not use drugs.  Past Medical History:  Diagnosis Date  . CFS (chronic fatigue syndrome)   . Fatigue   . Goiter   . HLD (hyperlipidemia)   . HTN (hypertension)   . Hypothyroid    Goiter  . Prediabetes     Past Surgical History:  Procedure Laterality Date  . COLONOSCOPY N/A 11/15/2015   Procedure: COLONOSCOPY;  Surgeon: West Bali, MD;  [facility];  Service: Endoscopy;  Laterality: N/A;  9:30 Am  . FOOT SURGERY     pinkie toe  . TONSILLECTOMY      Current Outpatient Medications  Medication Sig Dispense Refill  . levothyroxine (SYNTHROID, LEVOTHROID) 75 MCG tablet Take 1 tablet (75 mcg total) by mouth daily. (Patient taking differently: Take 75 mcg by mouth every other day. ) 90 tablet 3  . Melatonin 5 MG TABS Take 1 tablet by mouth at bedtime.    [name] SYNTHROID 88 MCG tablet Take 88 mcg daily (Patient taking differently: every other day. ) 90 tablet 3  . Vitamin D, Ergocalciferol, (DRISDOL) 1.25 MG (50000 UNIT) CAPS capsule TAKE 1 CAPSULE BY MOUTH  EVERY 7 DAYS (Patient taking differently: every 14 (fourteen) days. ) 4 capsule 1   No current facility-administered medications for this visit.    Family History  Problem Relation Age of Onset  . Hypertension Father   . Hyperlipidemia Father   . Colon cancer Maternal Grandmother   . Diabetes Paternal Grandmother   . Thyroid disease Mother     Review of Systems  Constitutional: Negative.   HENT: Negative.   Eyes: Negative.   Respiratory: Negative.   Cardiovascular: Negative.   Gastrointestinal: Negative.   Endocrine: Negative.   Genitourinary: Negative.   Musculoskeletal: Negative.   Skin: Negative.   Allergic/Immunologic: Negative.   Neurological: Negative.   Hematological: Negative.   Psychiatric/Behavioral: Negative.     Exam:   BP 112/80   Pulse 76   Temp (!) 97.4 F (36.3 C) (Skin)   Resp 16   Ht 5' 3.25" (1.607 m)   Wt 153 lb (69.4 kg)   BMI 26.89 kg/m   Height: 5' 3.25" (160.7 cm)  General appearance: alert, cooperative and appears stated age Head: Normocephalic, without obvious abnormality, atraumatic Neck: no adenopathy, supple, symmetrical, trachea midline and thyroid normal to inspection and palpation Lungs: clear to auscultation bilaterally Breasts: normal appearance, no masses or tenderness Heart: regular rate  and rhythm Abdomen: soft, non-tender; bowel sounds normal; no masses,  no organomegaly Extremities: extremities normal, atraumatic, no cyanosis or edema Skin: Skin color, texture, turgor normal. No rashes or lesions Lymph nodes: Cervical, supraclavicular, and axillary nodes normal. No abnormal inguinal nodes palpated Neurologic: Grossly normal   Pelvic: External genitalia:  no lesions              Urethra:  normal appearing urethra with no masses, tenderness or lesions              Bartholins and Skenes: normal                 Vagina: normal appearing vagina with normal color and discharge, no lesions              Cervix: no lesions               Pap taken: Yes.   Bimanual Exam:  Uterus:  normal size, contour, position, consistency, mobility, non-tender              Adnexa: normal adnexa and no mass, fullness, tenderness               Rectovaginal: Confirms               Anus:  normal sphincter tone, no lesions  Chaperone, Terence Lux, CMA, was present for exam.  A:  Well Woman with normal exam PMP, no HRT Hypothyroidism Family hx of colon cancer MGM Mildly elevated cholesterol  P:   Mammogram guidelines reviewed.  Pt is aware this is due. pap smear with HR HPV obtained today Will plan blood work with Dr. Leafy Ro RF for synthroid 60mcg every other alternating with 14mcg every other.  1 year RF for both RF for Vit D 50K every 14 days.  #6/1RF Colonoscopy is UTD Order for BMD placed and will be faxed to [facility] return annually or prn

## 2019-07-10 ENCOUNTER — Other Ambulatory Visit: Payer: Self-pay

## 2019-07-10 ENCOUNTER — Ambulatory Visit (INDEPENDENT_AMBULATORY_CARE_PROVIDER_SITE_OTHER): Payer: BC Managed Care – PPO | Admitting: Obstetrics & Gynecology

## 2019-07-10 ENCOUNTER — Encounter: Payer: Self-pay | Admitting: Obstetrics & Gynecology

## 2019-07-10 ENCOUNTER — Other Ambulatory Visit (HOSPITAL_COMMUNITY)
Admission: RE | Admit: 2019-07-10 | Discharge: 2019-07-10 | Disposition: A | Discharge status: Home / Self Care | Payer: BC Managed Care – PPO | Source: Ambulatory Visit | Attending: Obstetrics & Gynecology | Admitting: Obstetrics & Gynecology

## 2019-07-10 VITALS — BP 112/80 | HR 76 | Temp 97.4°F | Resp 16 | Ht 63.25 in | Wt 153.0 lb

## 2019-07-10 DIAGNOSIS — E559 Vitamin D deficiency, unspecified: Secondary | ICD-10-CM

## 2019-07-10 DIAGNOSIS — Z01419 Encounter for gynecological examination (general) (routine) without abnormal findings: Secondary | ICD-10-CM

## 2019-07-10 DIAGNOSIS — Z124 Encounter for screening for malignant neoplasm of cervix: Secondary | ICD-10-CM | POA: Diagnosis not present

## 2019-07-10 MED ORDER — LEVOTHYROXINE SODIUM 75 MCG PO TABS: 75.0000 ug | ORAL_TABLET | ORAL | 2 refills | Status: DC

## 2019-07-10 MED ORDER — LEVOTHYROXINE SODIUM 88 MCG PO TABS: 88.0000 ug | ORAL_TABLET | ORAL | 2 refills | Status: DC

## 2019-07-10 MED ORDER — VITAMIN D (ERGOCALCIFEROL) 1.25 MG (50000 UNIT) PO CAPS: 50000.0000 [IU] | ORAL_CAPSULE | ORAL | 1 refills | Status: DC

## 2019-07-14 LAB — CYTOLOGY - PAP
Comment: NEGATIVE
Diagnosis: NEGATIVE
High risk HPV: NEGATIVE

## 2019-07-17 ENCOUNTER — Other Ambulatory Visit (INDEPENDENT_AMBULATORY_CARE_PROVIDER_SITE_OTHER): Payer: Self-pay | Admitting: Family Medicine

## 2019-07-17 DIAGNOSIS — E559 Vitamin D deficiency, unspecified: Secondary | ICD-10-CM

## 2019-08-13 ENCOUNTER — Ambulatory Visit (INDEPENDENT_AMBULATORY_CARE_PROVIDER_SITE_OTHER): Payer: BC Managed Care – PPO | Admitting: Family Medicine

## 2019-08-14 ENCOUNTER — Encounter (INDEPENDENT_AMBULATORY_CARE_PROVIDER_SITE_OTHER): Payer: Self-pay | Admitting: Bariatrics

## 2019-08-14 ENCOUNTER — Ambulatory Visit (INDEPENDENT_AMBULATORY_CARE_PROVIDER_SITE_OTHER): Payer: BC Managed Care – PPO | Admitting: Bariatrics

## 2019-08-14 ENCOUNTER — Other Ambulatory Visit: Payer: Self-pay

## 2019-08-14 VITALS — BP 127/77 | HR 62 | Temp 97.9°F | Ht 63.0 in | Wt 152.0 lb

## 2019-08-14 DIAGNOSIS — Z780 Asymptomatic menopausal state: Secondary | ICD-10-CM | POA: Diagnosis not present

## 2019-08-14 DIAGNOSIS — E038 Other specified hypothyroidism: Secondary | ICD-10-CM | POA: Diagnosis not present

## 2019-08-14 DIAGNOSIS — Z9189 Other specified personal risk factors, not elsewhere classified: Secondary | ICD-10-CM

## 2019-08-14 DIAGNOSIS — E559 Vitamin D deficiency, unspecified: Secondary | ICD-10-CM | POA: Diagnosis not present

## 2019-08-14 DIAGNOSIS — Z13820 Encounter for screening for osteoporosis: Secondary | ICD-10-CM | POA: Diagnosis not present

## 2019-08-14 DIAGNOSIS — E669 Obesity, unspecified: Secondary | ICD-10-CM | POA: Diagnosis not present

## 2019-08-14 DIAGNOSIS — Z1231 Encounter for screening mammogram for malignant neoplasm of breast: Secondary | ICD-10-CM | POA: Diagnosis not present

## 2019-08-14 DIAGNOSIS — Z6830 Body mass index (BMI) 30.0-30.9, adult: Secondary | ICD-10-CM

## 2019-08-14 MED ORDER — VITAMIN D (ERGOCALCIFEROL) 1.25 MG (50000 UNIT) PO CAPS: 50000.0000 [IU] | ORAL_CAPSULE | ORAL | 1 refills | Status: DC

## 2019-08-18 DIAGNOSIS — E66811 Obesity, class 1: Secondary | ICD-10-CM | POA: Insufficient documentation

## 2019-08-18 DIAGNOSIS — E669 Obesity, unspecified: Secondary | ICD-10-CM | POA: Insufficient documentation

## 2019-08-18 NOTE — Progress Notes (Signed)
Chief Complaint:   OBESITY Deborah Miranda is here to discuss her progress with her obesity treatment plan along with follow-up of her obesity related diagnoses. Deborah Miranda is on the [employer] and states she is following her eating plan approximately 50% of the time. Deborah Miranda states she is walking 30 minutes 5 times per week.  Today's visit was #: 11 Starting weight: 170 lbs Starting date: 09/10/2018 Today's weight: 152 lbs Today's date: 08/14/2019 Total lbs lost to date: 18 Total lbs lost since last in-office visit: 0  Interim History: Deborah Miranda is up an additional 4 lbs since her last visit, but doing well overall. She has not been here since 03/24/2019. She is up 3.4 lbs of water during the holiday.  Subjective:   Vitamin D deficiency. No nausea, vomiting, or muscle weakness.    Ref. Range 12/19/2018 08:29  Vitamin D, 25-Hydroxy Latest Ref Range: 30.0 - 100.0 ng/mL 70.9   Other specified hypothyroidism. Deborah Miranda is taking Synthroid. Thyroid levels are controlled.  Lab Results  Component Value Date   TSH 1.410 09/10/2018   At risk for dehydration. Deborah Miranda is at increased risk for dehydration due to summer weather and increased activities.  Assessment/Plan:   Vitamin D deficiency. Low Vitamin D level contributes to fatigue and are associated with obesity, breast, and colon cancer. She was given a prescription for Vitamin D, Ergocalciferol, (DRISDOL) 1.25 MG (50000 UNIT) CAPS capsule every 14 days #2 with 0 refills and will follow-up for routine testing of Vitamin D, at least 2-3 times per year to avoid over-replacement.   Other specified hypothyroidism. Patient with long-standing hypothyroidism, on levothyroxine therapy. She appears euthyroid. Orders and follow up as documented in patient record. Deborah Miranda will continue her medication as directed.   Counseling . Good thyroid control is important for overall health. Supratherapeutic thyroid levels are dangerous and will  not improve weight loss results. . The correct way to take levothyroxine is fasting, with water, separated by at least 30 minutes from breakfast, and separated by more than 4 hours from calcium, iron, multivitamins, acid reflux medications (PPIs).   At risk for dehydration. Deborah Miranda was given approximately 15 minutes dehydration prevention counseling today. Deborah Miranda is at risk for dehydration due to weight loss and current medication(s). She was encouraged to hydrate and monitor fluid status to avoid dehydration as well as weight loss plateaus.   Class 1 obesity with serious comorbidity and body mass index (BMI) of 30.0 to 30.9 in adult, unspecified obesity type - BMI greater than 30 at start  Deborah Miranda is currently in the action stage of change. As such, her goal is to continue with weight loss efforts. She has agreed to the [employer].   She will work on meal planning and intentional eating.   Exercise goals: All adults should avoid inactivity. Some physical activity is better than none, and adults who participate in any amount of physical activity gain some health benefits.  Behavioral modification strategies: increasing lean protein intake, decreasing simple carbohydrates, increasing vegetables, increasing water intake, decreasing eating out, no skipping meals, meal planning and cooking strategies, keeping healthy foods in the home and planning for success.  Deborah Miranda has agreed to follow-up with our clinic in 2 weeks. She was informed of the importance of frequent follow-up visits to maximize her success with intensive lifestyle modifications for her multiple health conditions.   Objective:   Blood pressure 127/77, pulse 62, temperature 97.9 F (36.6 C), height 5\' 3"  (1.6 m), weight 152 lb (68.9  kg), SpO2 98 %. Body mass index is 26.93 kg/m.  General: Cooperative, alert, well developed, in no acute distress. HEENT: Conjunctivae and lids unremarkable. Cardiovascular: Regular rhythm.   Lungs: Normal work of breathing. Neurologic: No focal deficits.   Lab Results  Component Value Date   CREATININE 0.75 12/19/2018   BUN 18 12/19/2018   NA 141 12/19/2018   K 4.0 12/19/2018   CL 101 12/19/2018   CO2 26 12/19/2018   Lab Results  Component Value Date   ALT 10 12/19/2018   AST 16 12/19/2018   ALKPHOS 74 12/19/2018   BILITOT 0.8 12/19/2018   Lab Results  Component Value Date   HGBA1C 5.9 (H) 12/19/2018   HGBA1C 6.3 (H) 09/10/2018   HGBA1C 6.2 03/25/2018   HGBA1C 6.2 (H) 06/12/2017   Lab Results  Component Value Date   INSULIN 10.0 12/19/2018   INSULIN 9.9 09/10/2018   Lab Results  Component Value Date   TSH 1.410 09/10/2018   Lab Results  Component Value Date   CHOL 204 (H) 12/19/2018   HDL 52 12/19/2018   LDLCALC 134 (H) 12/19/2018   TRIG 98 12/19/2018   CHOLHDL 3.8 07/05/2018   Lab Results  Component Value Date   WBC 5.5 09/10/2018   HGB 14.4 09/10/2018   HCT 43.9 09/10/2018   MCV 85 09/10/2018   PLT 215 07/05/2018   Lab Results  Component Value Date   FERRITIN 15 04/30/2014   Attestation Statements:   Reviewed by clinician on day of visit: allergies, medications, problem list, medical history, surgical history, family history, social history, and previous encounter notes.  Fernanda Drum, am acting as Energy manager for [employer], DO   I have reviewed the above documentation for accuracy and completeness, and I agree with the above. Corinna Capra, DO

## 2019-09-08 ENCOUNTER — Encounter: Payer: Self-pay | Admitting: Obstetrics & Gynecology

## 2019-09-08 ENCOUNTER — Telehealth: Payer: Self-pay

## 2019-09-08 NOTE — Telephone Encounter (Signed)
Patient notified of normal bone density results. 

## 2019-11-26 DIAGNOSIS — L570 Actinic keratosis: Secondary | ICD-10-CM | POA: Diagnosis not present

## 2020-01-29 DIAGNOSIS — H1089 Other conjunctivitis: Secondary | ICD-10-CM | POA: Diagnosis not present

## 2020-02-19 ENCOUNTER — Other Ambulatory Visit (INDEPENDENT_AMBULATORY_CARE_PROVIDER_SITE_OTHER): Payer: Self-pay | Admitting: Family Medicine

## 2020-02-23 NOTE — Telephone Encounter (Signed)
Last OV with Dr Brown 

## 2020-03-02 ENCOUNTER — Other Ambulatory Visit: Payer: Self-pay | Admitting: Obstetrics & Gynecology

## 2020-03-02 DIAGNOSIS — E559 Vitamin D deficiency, unspecified: Secondary | ICD-10-CM

## 2020-04-23 ENCOUNTER — Other Ambulatory Visit (INDEPENDENT_AMBULATORY_CARE_PROVIDER_SITE_OTHER): Payer: Self-pay | Admitting: Bariatrics

## 2020-04-23 DIAGNOSIS — E559 Vitamin D deficiency, unspecified: Secondary | ICD-10-CM

## 2020-07-29 ENCOUNTER — Ambulatory Visit (HOSPITAL_BASED_OUTPATIENT_CLINIC_OR_DEPARTMENT_OTHER): Payer: BC Managed Care – PPO | Admitting: Obstetrics & Gynecology

## 2020-08-10 ENCOUNTER — Encounter (HOSPITAL_BASED_OUTPATIENT_CLINIC_OR_DEPARTMENT_OTHER): Payer: Self-pay | Admitting: Obstetrics & Gynecology

## 2020-08-10 ENCOUNTER — Ambulatory Visit (HOSPITAL_BASED_OUTPATIENT_CLINIC_OR_DEPARTMENT_OTHER)
Admission: RE | Admit: 2020-08-10 | Discharge: 2020-08-10 | Disposition: A | Discharge status: Home / Self Care | Payer: BC Managed Care – PPO | Source: Ambulatory Visit | Attending: Obstetrics & Gynecology | Admitting: Obstetrics & Gynecology

## 2020-08-10 ENCOUNTER — Other Ambulatory Visit: Payer: Self-pay

## 2020-08-10 ENCOUNTER — Ambulatory Visit (INDEPENDENT_AMBULATORY_CARE_PROVIDER_SITE_OTHER): Payer: BC Managed Care – PPO | Admitting: Obstetrics & Gynecology

## 2020-08-10 ENCOUNTER — Telehealth (HOSPITAL_BASED_OUTPATIENT_CLINIC_OR_DEPARTMENT_OTHER): Payer: Self-pay | Admitting: *Deleted

## 2020-08-10 VITALS — BP 162/78 | HR 64 | Ht 64.0 in | Wt 167.5 lb

## 2020-08-10 DIAGNOSIS — Z780 Asymptomatic menopausal state: Secondary | ICD-10-CM

## 2020-08-10 DIAGNOSIS — Z1231 Encounter for screening mammogram for malignant neoplasm of breast: Secondary | ICD-10-CM

## 2020-08-10 DIAGNOSIS — Z800 Family history of malignant neoplasm of digestive organs: Secondary | ICD-10-CM

## 2020-08-10 DIAGNOSIS — Z0000 Encounter for general adult medical examination without abnormal findings: Secondary | ICD-10-CM | POA: Diagnosis not present

## 2020-08-10 DIAGNOSIS — E559 Vitamin D deficiency, unspecified: Secondary | ICD-10-CM | POA: Diagnosis not present

## 2020-08-10 DIAGNOSIS — Z01419 Encounter for gynecological examination (general) (routine) without abnormal findings: Secondary | ICD-10-CM

## 2020-08-10 DIAGNOSIS — E039 Hypothyroidism, unspecified: Secondary | ICD-10-CM | POA: Diagnosis not present

## 2020-08-10 MED ORDER — LEVOTHYROXINE SODIUM 75 MCG PO TABS: 75.0000 ug | ORAL_TABLET | ORAL | 2 refills | Status: DC

## 2020-08-10 MED ORDER — VITAMIN D (ERGOCALCIFEROL) 1.25 MG (50000 UNIT) PO CAPS: 50000.0000 [IU] | ORAL_CAPSULE | ORAL | 1 refills | Status: DC

## 2020-08-10 MED ORDER — LEVOTHYROXINE SODIUM 88 MCG PO TABS: 88.0000 ug | ORAL_TABLET | ORAL | 2 refills | Status: DC

## 2020-08-10 NOTE — Progress Notes (Signed)
60 y.o. G26P2002 Married Other or two or more races female here for annual exam.  Denies vaginal bleeding.    Lots of stressors at home with husband who has Parkinson's.  She is changing jobs next week.  No LMP recorded. (Menstrual status: Perimenopausal).          Sexually active: Yes.    The current method of family planning is post menopausal status.    Exercising: a little bit Smoker:  no  Health Maintenance: Pap:  07/10/2019 Negative History of abnormal Pap:  no MMG:  08/14/2019 Negative Colonoscopy:  11/15/2015, follow up 10 years BMD:   09/08/2019 TDaP:  06/2017 Pneumonia vaccine(s):  not indicated Shingrix:   declined Hep C testing: 2017 Screening Labs: ordered today   reports that she has never smoked. She has never used smokeless tobacco. She reports previous alcohol use. She reports that she does not use drugs.  Past Medical History:  Diagnosis Date   CFS (chronic fatigue syndrome)    Fatigue    Goiter    HLD (hyperlipidemia)    HTN (hypertension)    Hypothyroid    Goiter   Prediabetes     Past Surgical History:  Procedure Laterality Date   COLONOSCOPY N/A 11/15/2015   Procedure: COLONOSCOPY;  Surgeon: West Bali, MD;  [facility];  Service: Endoscopy;  Laterality: N/A;  9:30 Am   FOOT SURGERY     pinkie toe   TONSILLECTOMY      Current Outpatient Medications  Medication Sig Dispense Refill   levothyroxine (SYNTHROID) 75 MCG tablet Take 1 tablet (75 mcg total) by mouth every other day. 90 tablet 2   levothyroxine (SYNTHROID) 88 MCG tablet Take 1 tablet (88 mcg total) by mouth every other day. 90 tablet 2   Melatonin 5 MG TABS Take 1 tablet by mouth at bedtime.     Vitamin D, Ergocalciferol, (DRISDOL) 1.25 MG (50000 UNIT) CAPS capsule Take 1 capsule (50,000 Units total) by mouth every 14 (fourteen) days. 6 capsule 1   No current facility-administered medications for this visit.    Family History  Problem Relation Age of Onset    Hypertension Father    Hyperlipidemia Father    Colon cancer Maternal Grandmother    Diabetes Paternal Grandmother    Thyroid disease Mother     Review of Systems  All other systems reviewed and are negative.  Exam:   BP (!) 162/78 (BP Location: Right Arm, Patient Position: Sitting, Cuff Size: Large)   Pulse 64   Ht 5\' 4"  (1.626 m)   Wt 167 lb 8 oz (76 kg)   BMI 28.75 kg/m   Height: 5\' 4"  (162.6 cm)  General appearance: alert, cooperative and appears stated age Head: Normocephalic, without obvious abnormality, atraumatic Neck: no adenopathy, supple, symmetrical, trachea midline and thyroid normal to inspection and palpation Lungs: clear to auscultation bilaterally Breasts: normal appearance, no masses or tenderness Heart: regular rate and rhythm Abdomen: soft, non-tender; bowel sounds normal; no masses,  no organomegaly Extremities: extremities normal, atraumatic, no cyanosis or edema Skin: Skin color, texture, turgor normal. No rashes or lesions Lymph nodes: Cervical, supraclavicular, and axillary nodes normal. No abnormal inguinal nodes palpated Neurologic: Grossly normal   Pelvic: External genitalia:  no lesions              Urethra:  normal appearing urethra with no masses, tenderness or lesions              Bartholins and Skenes:  normal                 Vagina: normal appearing vagina with normal color and no discharge, no lesions              Cervix: no lesions              Pap taken: No. Bimanual Exam:  Uterus:  normal size, contour, position, consistency, mobility, non-tender              Adnexa: normal adnexa and no mass, fullness, tenderness               Rectovaginal: Confirms               Anus:  normal sphincter tone, no lesions  Chaperone, Ina Homes, CMA, was present for exam.  Assessment/Plan: 1. Well woman exam with routine gynecological exam - pap with neg HR HPV 2021 - MMG 08/14/2019 - colonoscopy up to date - will order lab work - vaccines  updated  2. Encounter for screening mammogram for malignant neoplasm of breast  3. Postmenopausal - no HRT  4. Acquired hypothyroidism - rx for thyroid medication ordered today  5. Family history of colon cancer  6.  Vit D def - rx for vit d 50K every 14 days - Vit D level obtained

## 2020-08-10 NOTE — Telephone Encounter (Signed)
Spoke with insurance regarding pts benefits and coverage. Called to verify if mammogram is covered every 366 days or once per calendar year. Representative stated that pts coverage for mammogram is once per plan year for screening mammogram. Rep stated that pts plan year for this year is from 07/07/20-07/06/2021.

## 2020-08-11 LAB — COMPREHENSIVE METABOLIC PANEL
ALT: 25 IU/L (ref 0–32)
AST: 24 IU/L (ref 0–40)
Albumin/Globulin Ratio: 1.9 (ref 1.2–2.2)
Albumin: 4.8 g/dL (ref 3.8–4.8)
Alkaline Phosphatase: 69 IU/L (ref 44–121)
BUN/Creatinine Ratio: 14 (ref 12–28)
BUN: 11 mg/dL (ref 8–27)
Bilirubin Total: 0.9 mg/dL (ref 0.0–1.2)
CO2: 26 mmol/L (ref 20–29)
Calcium: 10 mg/dL (ref 8.7–10.3)
Chloride: 102 mmol/L (ref 96–106)
Creatinine, Ser: 0.77 mg/dL (ref 0.57–1.00)
Globulin, Total: 2.5 g/dL (ref 1.5–4.5)
Glucose: 114 mg/dL — ABNORMAL HIGH (ref 65–99)
Potassium: 4.5 mmol/L (ref 3.5–5.2)
Sodium: 141 mmol/L (ref 134–144)
Total Protein: 7.3 g/dL (ref 6.0–8.5)
eGFR: 88 mL/min/{1.73_m2} (ref >59)

## 2020-08-11 LAB — VITAMIN D 25 HYDROXY (VIT D DEFICIENCY, FRACTURES): Vit D, 25-Hydroxy: 41.2 ng/mL (ref 30.0–100.0)

## 2020-08-11 LAB — CBC
Hematocrit: 44.9 % (ref 34.0–46.6)
Hemoglobin: 14.8 g/dL (ref 11.1–15.9)
MCH: 27.8 pg (ref 26.6–33.0)
MCHC: 33 g/dL (ref 31.5–35.7)
MCV: 84 fL (ref 79–97)
Platelets: 233 10*3/uL (ref 150–450)
RBC: 5.33 x10E6/uL — ABNORMAL HIGH (ref 3.77–5.28)
RDW: 13.2 % (ref 11.7–15.4)
WBC: 5.2 10*3/uL (ref 3.4–10.8)

## 2020-08-11 LAB — HEMOGLOBIN A1C
Est. average glucose Bld gHb Est-mCnc: 137 mg/dL
Hgb A1c MFr Bld: 6.4 % — ABNORMAL HIGH (ref 4.8–5.6)

## 2020-08-11 LAB — TSH: TSH: 0.335 u[IU]/mL — ABNORMAL LOW (ref 0.450–4.500)

## 2020-08-13 ENCOUNTER — Ambulatory Visit (HOSPITAL_BASED_OUTPATIENT_CLINIC_OR_DEPARTMENT_OTHER): Payer: BC Managed Care – PPO | Admitting: Obstetrics & Gynecology

## 2020-08-16 ENCOUNTER — Other Ambulatory Visit (HOSPITAL_BASED_OUTPATIENT_CLINIC_OR_DEPARTMENT_OTHER): Payer: Self-pay | Admitting: Obstetrics & Gynecology

## 2020-08-16 DIAGNOSIS — Z1231 Encounter for screening mammogram for malignant neoplasm of breast: Secondary | ICD-10-CM

## 2020-09-07 ENCOUNTER — Ambulatory Visit: Payer: Self-pay

## 2020-11-03 ENCOUNTER — Telehealth (HOSPITAL_BASED_OUTPATIENT_CLINIC_OR_DEPARTMENT_OTHER): Payer: Self-pay | Admitting: Obstetrics & Gynecology

## 2020-11-03 NOTE — Telephone Encounter (Signed)
Called patient and left a message to please call the office . 

## 2020-12-23 ENCOUNTER — Other Ambulatory Visit (HOSPITAL_BASED_OUTPATIENT_CLINIC_OR_DEPARTMENT_OTHER): Payer: Self-pay | Admitting: Obstetrics & Gynecology

## 2020-12-23 MED ORDER — LEVOTHYROXINE SODIUM 75 MCG PO TABS: 75.0000 ug | ORAL_TABLET | ORAL | 2 refills | Status: DC

## 2021-08-15 ENCOUNTER — Ambulatory Visit (HOSPITAL_BASED_OUTPATIENT_CLINIC_OR_DEPARTMENT_OTHER): Payer: BC Managed Care – PPO | Admitting: Obstetrics & Gynecology

## 2021-08-15 ENCOUNTER — Ambulatory Visit (HOSPITAL_BASED_OUTPATIENT_CLINIC_OR_DEPARTMENT_OTHER): Payer: BC Managed Care – PPO | Admitting: Radiology

## 2021-08-23 DIAGNOSIS — R109 Unspecified abdominal pain: Secondary | ICD-10-CM | POA: Diagnosis not present

## 2021-08-23 DIAGNOSIS — M545 Low back pain, unspecified: Secondary | ICD-10-CM | POA: Diagnosis not present

## 2021-08-23 DIAGNOSIS — N200 Calculus of kidney: Secondary | ICD-10-CM | POA: Diagnosis not present

## 2021-09-14 ENCOUNTER — Encounter (INDEPENDENT_AMBULATORY_CARE_PROVIDER_SITE_OTHER): Payer: Self-pay

## 2021-11-12 ENCOUNTER — Encounter (HOSPITAL_BASED_OUTPATIENT_CLINIC_OR_DEPARTMENT_OTHER): Payer: Self-pay | Admitting: Obstetrics & Gynecology

## 2021-11-14 DIAGNOSIS — Z1231 Encounter for screening mammogram for malignant neoplasm of breast: Secondary | ICD-10-CM | POA: Diagnosis not present

## 2021-11-24 DIAGNOSIS — E559 Vitamin D deficiency, unspecified: Secondary | ICD-10-CM | POA: Diagnosis not present

## 2021-11-24 DIAGNOSIS — Z0000 Encounter for general adult medical examination without abnormal findings: Secondary | ICD-10-CM | POA: Diagnosis not present

## 2021-11-24 DIAGNOSIS — E039 Hypothyroidism, unspecified: Secondary | ICD-10-CM | POA: Diagnosis not present

## 2021-11-24 DIAGNOSIS — Z1159 Encounter for screening for other viral diseases: Secondary | ICD-10-CM | POA: Diagnosis not present

## 2021-11-25 DIAGNOSIS — E785 Hyperlipidemia, unspecified: Secondary | ICD-10-CM | POA: Insufficient documentation

## 2021-11-25 DIAGNOSIS — E1169 Type 2 diabetes mellitus with other specified complication: Secondary | ICD-10-CM | POA: Insufficient documentation

## 2021-12-07 DIAGNOSIS — R7309 Other abnormal glucose: Secondary | ICD-10-CM | POA: Diagnosis not present

## 2021-12-08 ENCOUNTER — Encounter (HOSPITAL_BASED_OUTPATIENT_CLINIC_OR_DEPARTMENT_OTHER): Payer: Self-pay | Admitting: Obstetrics & Gynecology

## 2021-12-08 ENCOUNTER — Other Ambulatory Visit (HOSPITAL_COMMUNITY)
Admission: RE | Admit: 2021-12-08 | Discharge: 2021-12-08 | Disposition: A | Discharge status: Home / Self Care | Payer: BC Managed Care – PPO | Source: Ambulatory Visit | Attending: Obstetrics & Gynecology | Admitting: Obstetrics & Gynecology

## 2021-12-08 ENCOUNTER — Ambulatory Visit (INDEPENDENT_AMBULATORY_CARE_PROVIDER_SITE_OTHER): Payer: BC Managed Care – PPO | Admitting: Obstetrics & Gynecology

## 2021-12-08 VITALS — BP 143/71 | HR 65 | Ht 64.0 in | Wt 169.2 lb

## 2021-12-08 DIAGNOSIS — Z124 Encounter for screening for malignant neoplasm of cervix: Secondary | ICD-10-CM | POA: Diagnosis not present

## 2021-12-08 DIAGNOSIS — Z1231 Encounter for screening mammogram for malignant neoplasm of breast: Secondary | ICD-10-CM | POA: Diagnosis not present

## 2021-12-08 DIAGNOSIS — E119 Type 2 diabetes mellitus without complications: Secondary | ICD-10-CM | POA: Diagnosis not present

## 2021-12-08 DIAGNOSIS — Z01419 Encounter for gynecological examination (general) (routine) without abnormal findings: Secondary | ICD-10-CM | POA: Diagnosis not present

## 2021-12-08 DIAGNOSIS — E039 Hypothyroidism, unspecified: Secondary | ICD-10-CM

## 2021-12-08 NOTE — Progress Notes (Signed)
61 y.o. G56P2002 Married Other or two or more races female here for annual exam.  Denies vaginal bleeding.  Not really having much hot flashes.  Feels warmer than she used to be.  Sleep is find.    Just had lab work done.  Was diagnosed with diabetes.  Cholesterol was elevated. She doesn't want to be on medication.  Is going to exercise and change diet.  Will retest in 3 months.    No LMP recorded. (Menstrual status: Perimenopausal).          Sexually active: No.  The current method of family planning is post menopausal status.    Exercising: walking at lunch Smoker:  no  Health Maintenance: Pap:  07/10/2019 Negative.  Desires to have done today. History of abnormal Pap:  no MMG:  11/14/2021 Negative Colonoscopy:  11/15/2015, follow up 10 years BMD:   09/08/2019 Screening Labs: just had done with PCP   reports that she has never smoked. She has never used smokeless tobacco. She reports that she does not currently use alcohol. She reports that she does not use drugs.  Past Medical History:  Diagnosis Date   CFS (chronic fatigue syndrome)    Fatigue    Goiter    HLD (hyperlipidemia)    HTN (hypertension)    Hypothyroid    Goiter   Prediabetes     Past Surgical History:  Procedure Laterality Date   COLONOSCOPY N/A 11/15/2015   Procedure: COLONOSCOPY;  Surgeon: West Bali, MD;  [facility];  Service: Endoscopy;  Laterality: N/A;  9:30 Am   FOOT SURGERY     pinkie toe   TONSILLECTOMY      Current Outpatient Medications  Medication Sig Dispense Refill   levothyroxine (SYNTHROID) 75 MCG tablet Take 1 tablet (75 mcg total) by mouth every other day. 90 tablet 2   levothyroxine (SYNTHROID) 88 MCG tablet Take 1 tablet (88 mcg total) by mouth every other day. 90 tablet 2   Vitamin D, Ergocalciferol, (DRISDOL) 1.25 MG (50000 UNIT) CAPS capsule Take 1 capsule (50,000 Units total) by mouth every 14 (fourteen) days. (Patient not taking: Reported on 12/08/2021) 6 capsule 1    No current facility-administered medications for this visit.    Family History  Problem Relation Age of Onset   Hypertension Father    Hyperlipidemia Father    Colon cancer Maternal Grandmother    Diabetes Paternal Grandmother    Thyroid disease Mother     ROS: Constitutional: negative Genitourinary:negative  Exam:   BP (!) 143/71 (BP Location: Right Arm, Patient Position: Sitting, Cuff Size: Large)   Pulse 65   Ht 5\' 4"  (1.626 m) Comment: Reported  Wt 169 lb 3.2 oz (76.7 kg)   BMI 29.04 kg/m   Height: 5\' 4"  (162.6 cm) (Reported)  General appearance: alert, cooperative and appears stated age Head: Normocephalic, without obvious abnormality, atraumatic Neck: no adenopathy, supple, symmetrical, trachea midline and thyroid normal to inspection and palpation Lungs: clear to auscultation bilaterally Breasts: normal appearance, no masses or tenderness Heart: regular rate and rhythm Abdomen: soft, non-tender; bowel sounds normal; no masses,  no organomegaly Extremities: extremities normal, atraumatic, no cyanosis or edema Skin: Skin color, texture, turgor normal. No rashes or lesions Lymph nodes: Cervical, supraclavicular, and axillary nodes normal. No abnormal inguinal nodes palpated Neurologic: Grossly normal   Pelvic: External genitalia:  no lesions              Urethra:  normal appearing urethra with no  masses, tenderness or lesions              Bartholins and Skenes: normal                 Vagina: normal appearing vagina with normal color and no discharge, no lesions              Cervix: no lesions              Pap taken: Yes.   Bimanual Exam:  Uterus:  normal size, contour, position, consistency, mobility, non-tender              Adnexa: normal adnexa and no mass, fullness, tenderness               Rectovaginal: Confirms               Anus:  normal sphincter tone, no lesions  Chaperone, Octaviano Batty, CMA, was present for exam.  Assessment/Plan: 1. Well woman  exam with routine gynecological exam - Pap smear obtained per pt request.  HR HPV not obtained today. - Mammogram 11/14/2021 - Colonoscopy 11/15/2015 - Bone mineral density 2021 - lab work done with PCP - vaccines reviewed.  Declined updating any.  2. Visit for screening mammogram - MM 3D SCREEN BREAST BILATERAL  3. Cervical cancer screening - Cytology - PAP( [place])  4. Type 2 diabetes mellitus without complication, without long-term current use of insulin ([place]) - followed by PCP in [place], [place]  5. Acquired hypothyroidism - prescription written by PCP today

## 2021-12-08 NOTE — Patient Instructions (Signed)
[facility] [address] [place], [place] [address]  Dr. Briscoe Deutscher

## 2021-12-09 LAB — CYTOLOGY - PAP: Diagnosis: NEGATIVE

## 2021-12-19 ENCOUNTER — Ambulatory Visit (HOSPITAL_BASED_OUTPATIENT_CLINIC_OR_DEPARTMENT_OTHER): Payer: Self-pay | Admitting: Obstetrics & Gynecology

## 2022-01-06 DIAGNOSIS — R7309 Other abnormal glucose: Secondary | ICD-10-CM | POA: Diagnosis not present

## 2022-02-06 DIAGNOSIS — R7309 Other abnormal glucose: Secondary | ICD-10-CM | POA: Diagnosis not present

## 2022-03-09 DIAGNOSIS — R7309 Other abnormal glucose: Secondary | ICD-10-CM | POA: Diagnosis not present

## 2022-03-22 DIAGNOSIS — E785 Hyperlipidemia, unspecified: Secondary | ICD-10-CM | POA: Diagnosis not present

## 2022-03-22 DIAGNOSIS — E119 Type 2 diabetes mellitus without complications: Secondary | ICD-10-CM | POA: Diagnosis not present

## 2022-03-22 DIAGNOSIS — E559 Vitamin D deficiency, unspecified: Secondary | ICD-10-CM | POA: Diagnosis not present

## 2022-03-22 DIAGNOSIS — E039 Hypothyroidism, unspecified: Secondary | ICD-10-CM | POA: Diagnosis not present

## 2022-04-07 DIAGNOSIS — R7309 Other abnormal glucose: Secondary | ICD-10-CM | POA: Diagnosis not present

## 2022-05-08 DIAGNOSIS — R7309 Other abnormal glucose: Secondary | ICD-10-CM | POA: Diagnosis not present

## 2022-05-09 DIAGNOSIS — E039 Hypothyroidism, unspecified: Secondary | ICD-10-CM | POA: Diagnosis not present

## 2022-05-09 DIAGNOSIS — E559 Vitamin D deficiency, unspecified: Secondary | ICD-10-CM | POA: Diagnosis not present

## 2022-05-09 DIAGNOSIS — E785 Hyperlipidemia, unspecified: Secondary | ICD-10-CM | POA: Diagnosis not present

## 2022-06-07 DIAGNOSIS — R7309 Other abnormal glucose: Secondary | ICD-10-CM | POA: Diagnosis not present

## 2022-07-08 DIAGNOSIS — R7309 Other abnormal glucose: Secondary | ICD-10-CM | POA: Diagnosis not present

## 2022-07-11 DIAGNOSIS — M9902 Segmental and somatic dysfunction of thoracic region: Secondary | ICD-10-CM | POA: Diagnosis not present

## 2022-07-11 DIAGNOSIS — M5416 Radiculopathy, lumbar region: Secondary | ICD-10-CM | POA: Diagnosis not present

## 2022-07-11 DIAGNOSIS — M9905 Segmental and somatic dysfunction of pelvic region: Secondary | ICD-10-CM | POA: Diagnosis not present

## 2022-07-11 DIAGNOSIS — M9903 Segmental and somatic dysfunction of lumbar region: Secondary | ICD-10-CM | POA: Diagnosis not present

## 2022-07-20 IMAGING — MG MM DIGITAL SCREENING BILAT W/ TOMO AND CAD
8 series · 8 of 24 positions shown · non-contrast
Comparison: Previous exam(s).

CLINICAL DATA: Screening.

EXAM:
DIGITAL SCREENING BILATERAL MAMMOGRAM WITH TOMOSYNTHESIS AND CAD
TECHNIQUE: Bilateral screening digital craniocaudal and mediolateral oblique
mammograms were obtained. Bilateral screening digital breast
tomosynthesis was performed. The images were evaluated with
computer-aided detection.

[R MLO synth-2D]
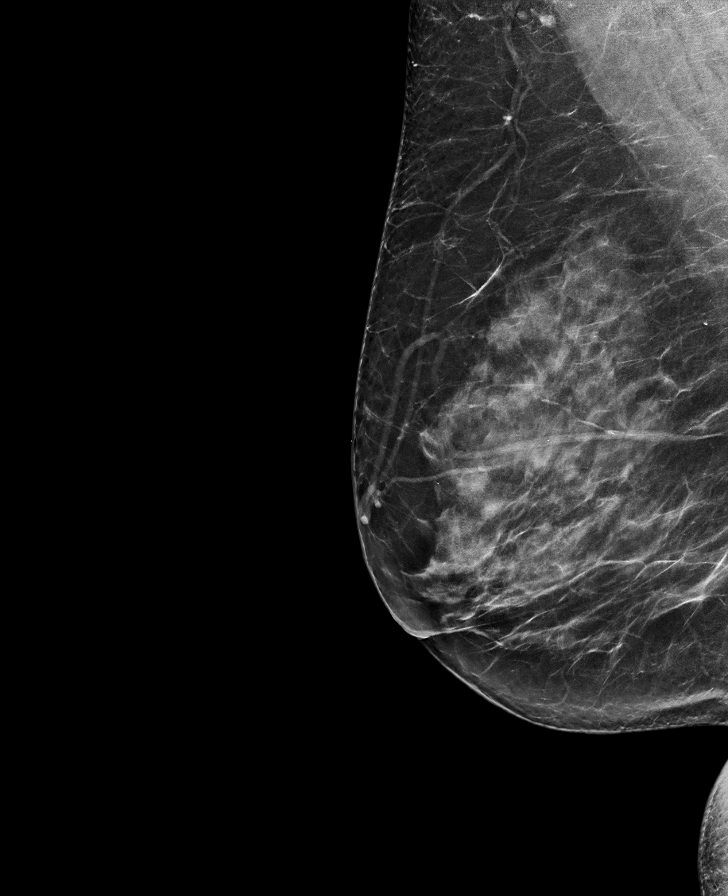

[L CC synth-2D]
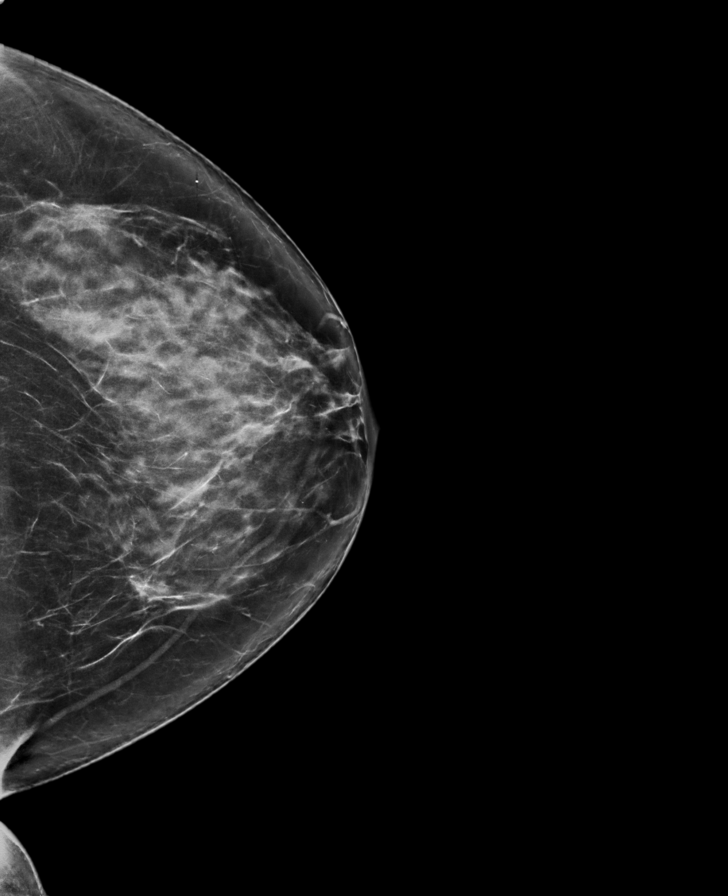

[R CC synth-2D]
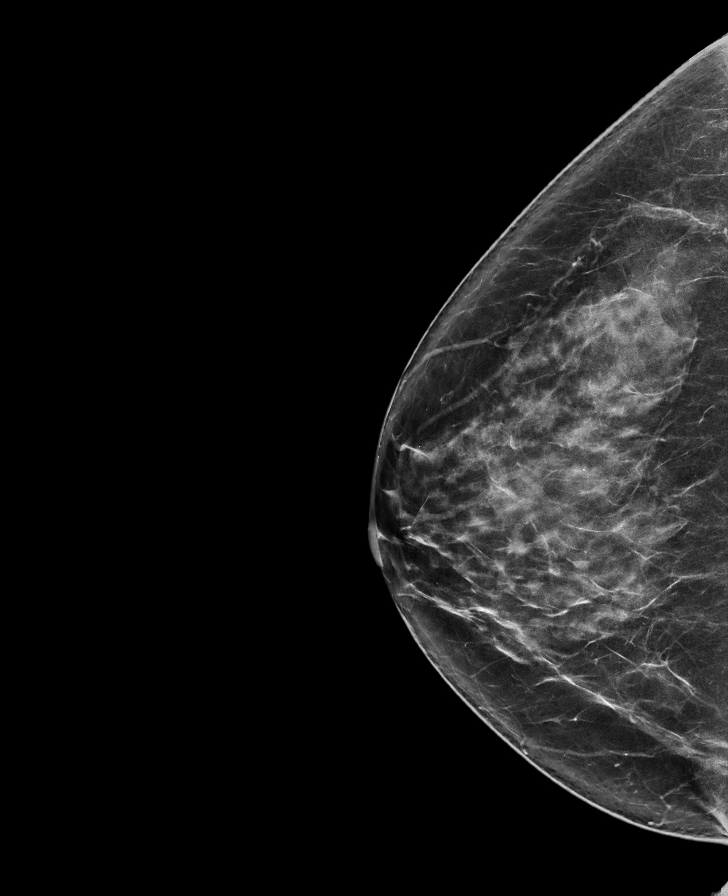

[L MLO synth-2D]
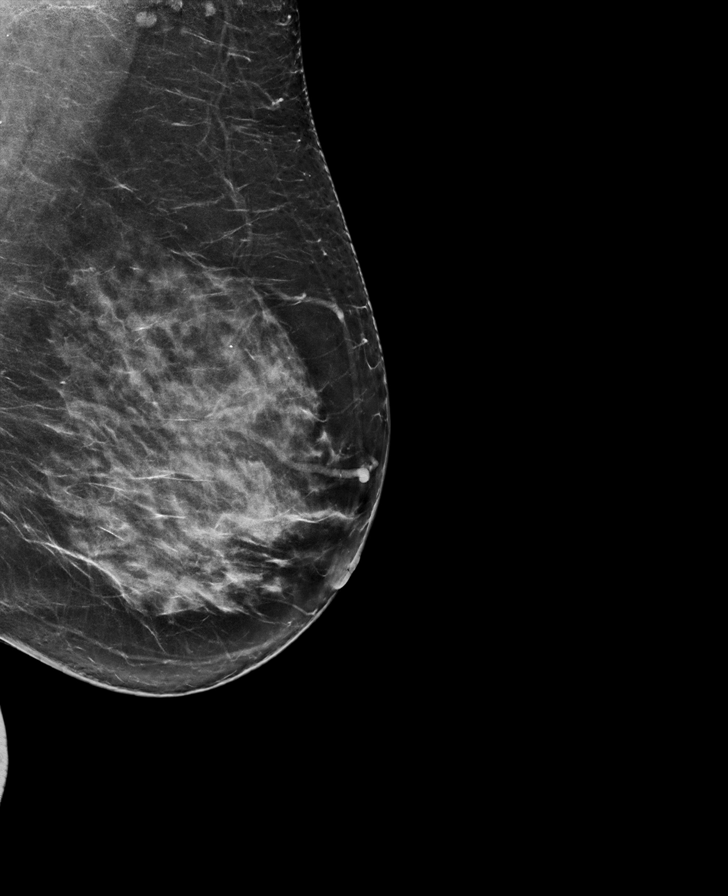

[L MLO tomo · tomo slice 41/81.0]
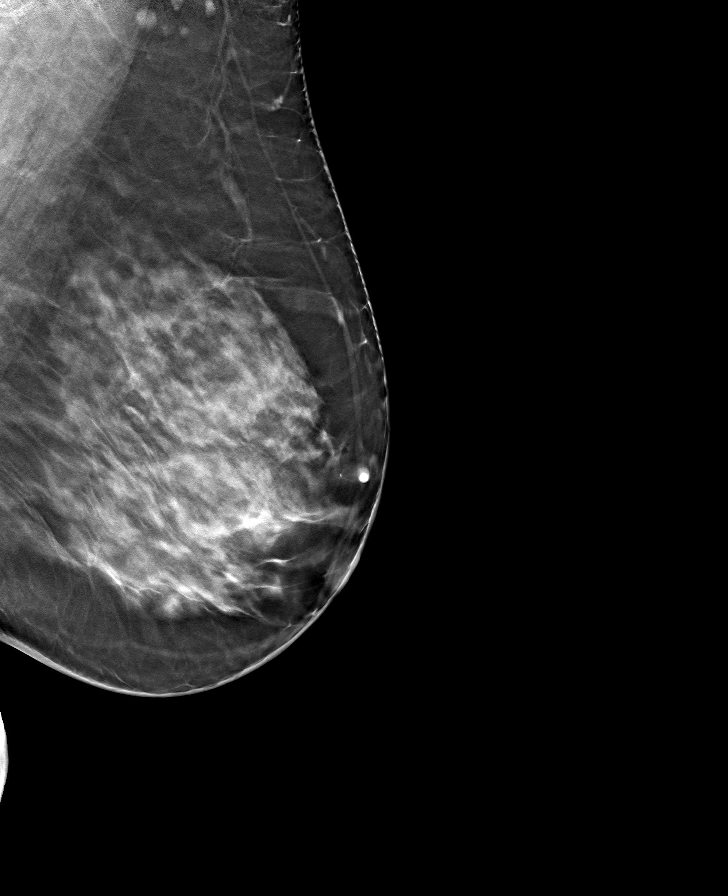

[R CC tomo · tomo slice 42/83.0]
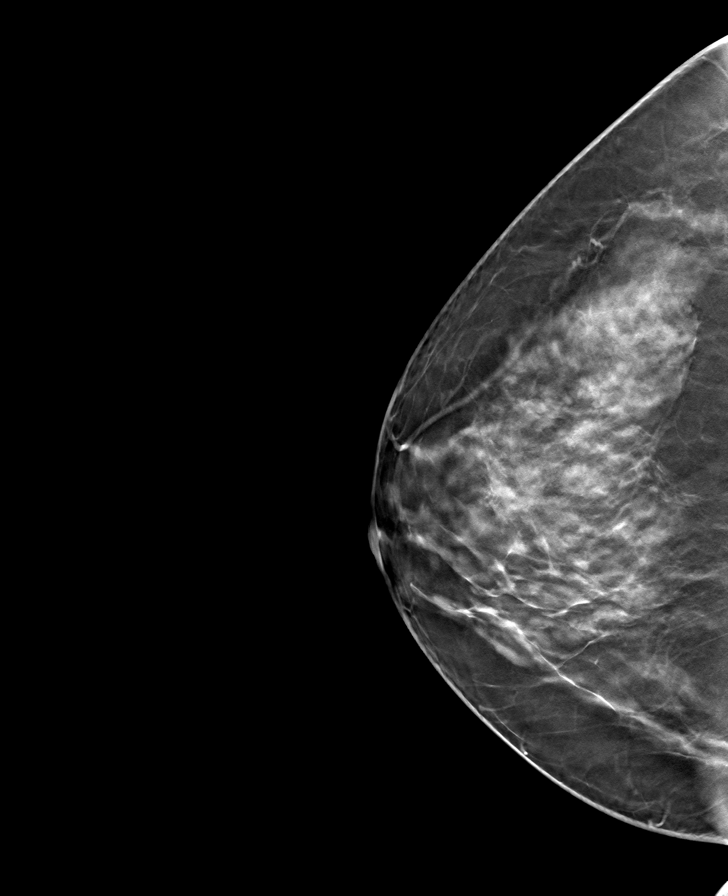

[L CC tomo · tomo slice 41/80.0]
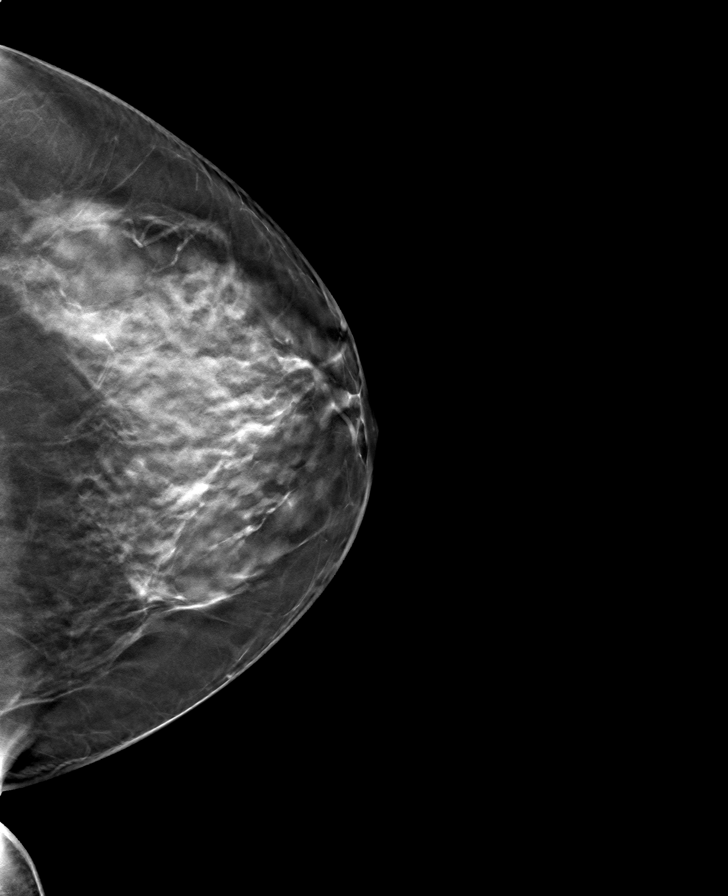

[R MLO tomo · tomo slice 40/79.0]
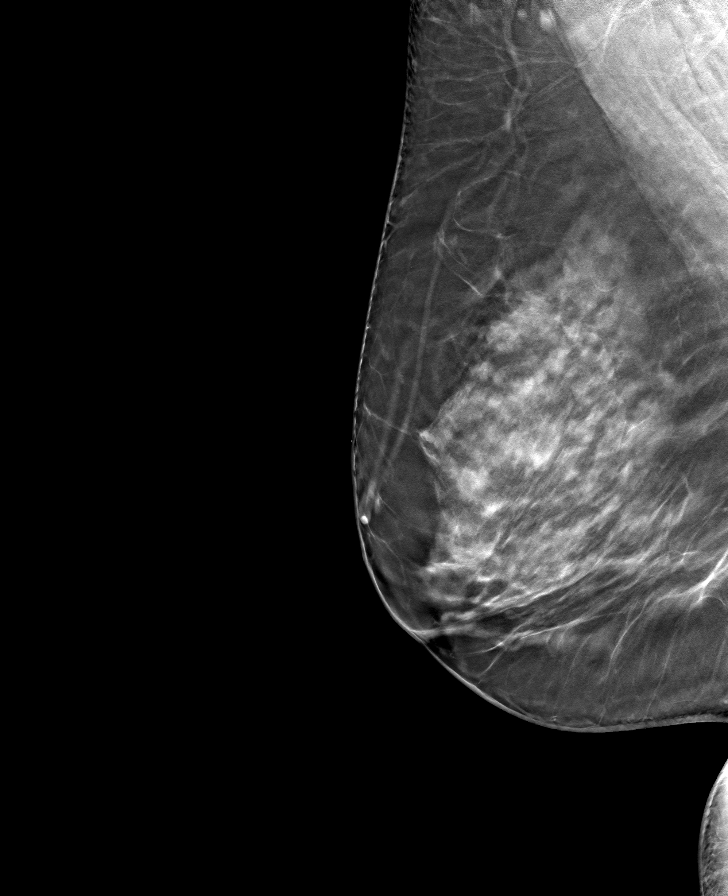

[8 of 24 positions shown; findings below may reference images not displayed]

ACR Breast Density Category c: The breast tissue is heterogeneously
dense, which may obscure small masses.
FINDINGS: There are no findings suspicious for malignancy.
IMPRESSION: No mammographic evidence of malignancy. A result letter of this
screening mammogram will be mailed directly to the patient.

RECOMMENDATION:
Screening mammogram in one year. (Code:[id number])

BI-RADS CATEGORY  1: Negative.

## 2022-08-07 DIAGNOSIS — R7309 Other abnormal glucose: Secondary | ICD-10-CM | POA: Diagnosis not present

## 2022-09-07 DIAGNOSIS — R7309 Other abnormal glucose: Secondary | ICD-10-CM | POA: Diagnosis not present

## 2022-09-28 DIAGNOSIS — M9902 Segmental and somatic dysfunction of thoracic region: Secondary | ICD-10-CM | POA: Diagnosis not present

## 2022-09-28 DIAGNOSIS — M9903 Segmental and somatic dysfunction of lumbar region: Secondary | ICD-10-CM | POA: Diagnosis not present

## 2022-09-28 DIAGNOSIS — M9905 Segmental and somatic dysfunction of pelvic region: Secondary | ICD-10-CM | POA: Diagnosis not present

## 2022-09-28 DIAGNOSIS — M5416 Radiculopathy, lumbar region: Secondary | ICD-10-CM | POA: Diagnosis not present

## 2022-10-08 DIAGNOSIS — R7309 Other abnormal glucose: Secondary | ICD-10-CM | POA: Diagnosis not present

## 2022-10-11 DIAGNOSIS — H00014 Hordeolum externum left upper eyelid: Secondary | ICD-10-CM | POA: Diagnosis not present

## 2022-10-16 DIAGNOSIS — H00014 Hordeolum externum left upper eyelid: Secondary | ICD-10-CM | POA: Diagnosis not present

## 2022-10-26 DIAGNOSIS — M5416 Radiculopathy, lumbar region: Secondary | ICD-10-CM | POA: Diagnosis not present

## 2022-10-26 DIAGNOSIS — M9905 Segmental and somatic dysfunction of pelvic region: Secondary | ICD-10-CM | POA: Diagnosis not present

## 2022-10-26 DIAGNOSIS — M9903 Segmental and somatic dysfunction of lumbar region: Secondary | ICD-10-CM | POA: Diagnosis not present

## 2022-10-26 DIAGNOSIS — M9902 Segmental and somatic dysfunction of thoracic region: Secondary | ICD-10-CM | POA: Diagnosis not present

## 2022-11-01 DIAGNOSIS — H0014 Chalazion left upper eyelid: Secondary | ICD-10-CM | POA: Diagnosis not present

## 2022-11-07 DIAGNOSIS — R7309 Other abnormal glucose: Secondary | ICD-10-CM | POA: Diagnosis not present

## 2022-11-14 DIAGNOSIS — Z0289 Encounter for other administrative examinations: Secondary | ICD-10-CM

## 2022-11-15 ENCOUNTER — Telehealth: Payer: Self-pay | Admitting: Gastroenterology

## 2022-11-15 NOTE — Telephone Encounter (Signed)
Hi Dr. Tomasa Rand,  Patient called requesting a transfer of care specifically over to you of possible, stated her husband is currently your patient and they are very please with you. Her records are available in Epic for yo to review and advise on scheduling.  Thank you

## 2022-11-23 NOTE — Telephone Encounter (Signed)
Patient advised on previous note. Will schedule for a later time .

## 2022-11-23 NOTE — Telephone Encounter (Signed)
Called patient to advise left voicemail.

## 2022-12-08 DIAGNOSIS — R7309 Other abnormal glucose: Secondary | ICD-10-CM | POA: Diagnosis not present

## 2022-12-11 ENCOUNTER — Encounter (INDEPENDENT_AMBULATORY_CARE_PROVIDER_SITE_OTHER): Payer: Self-pay | Admitting: Family Medicine

## 2022-12-11 ENCOUNTER — Ambulatory Visit (INDEPENDENT_AMBULATORY_CARE_PROVIDER_SITE_OTHER): Payer: BC Managed Care – PPO | Admitting: Family Medicine

## 2022-12-18 ENCOUNTER — Ambulatory Visit (HOSPITAL_BASED_OUTPATIENT_CLINIC_OR_DEPARTMENT_OTHER): Payer: BC Managed Care – PPO | Admitting: Obstetrics & Gynecology

## 2022-12-18 ENCOUNTER — Encounter (HOSPITAL_BASED_OUTPATIENT_CLINIC_OR_DEPARTMENT_OTHER): Payer: Self-pay | Admitting: Obstetrics & Gynecology

## 2022-12-18 VITALS — BP 145/81 | HR 71 | Ht 63.75 in | Wt 165.8 lb

## 2022-12-18 DIAGNOSIS — E119 Type 2 diabetes mellitus without complications: Secondary | ICD-10-CM | POA: Diagnosis not present

## 2022-12-18 DIAGNOSIS — E039 Hypothyroidism, unspecified: Secondary | ICD-10-CM

## 2022-12-18 DIAGNOSIS — E559 Vitamin D deficiency, unspecified: Secondary | ICD-10-CM

## 2022-12-18 DIAGNOSIS — Z01419 Encounter for gynecological examination (general) (routine) without abnormal findings: Secondary | ICD-10-CM | POA: Diagnosis not present

## 2022-12-18 DIAGNOSIS — Z1231 Encounter for screening mammogram for malignant neoplasm of breast: Secondary | ICD-10-CM | POA: Diagnosis not present

## 2022-12-18 DIAGNOSIS — E785 Hyperlipidemia, unspecified: Secondary | ICD-10-CM | POA: Diagnosis not present

## 2022-12-18 MED ORDER — LEVOTHYROXINE SODIUM 88 MCG PO TABS: 88.0000 ug | ORAL_TABLET | ORAL | 2 refills | Status: DC

## 2022-12-18 MED ORDER — LEVOTHYROXINE SODIUM 75 MCG PO TABS: 75.0000 ug | ORAL_TABLET | ORAL | 2 refills | Status: DC

## 2022-12-18 NOTE — Progress Notes (Signed)
62 y.o. G39P2002 Married Other or two or more races female here for annual exam.  Husband is getting progressively worse.    Did have an appt with Dr. Dalbert Garnet but didn't have packet filled out and she is rescheduled for December.  Denies vaginal bleeding.    Had blood work done in the late summer.  HbA1C was borderline for diabetes.  Medications recommended.  Pt really wants to do this with weight loss and exercise.    No LMP recorded. (Menstrual status: Perimenopausal).          Sexually active: No.  The current method of family planning is post menopausal status.    Smoker:  no  Health Maintenance: Pap:  12/08/2021 Negative History of abnormal Pap:  no MMG:  11/14/2021 Negative, done today at [facility] mammogram.   Colonoscopy:  11/15/2015 BMD:   09/08/2019 Screening Labs: does with PCP   reports that she has never smoked. She has never used smokeless tobacco. She reports that she does not currently use alcohol. She reports that she does not use drugs.  Past Medical History:  Diagnosis Date   CFS (chronic fatigue syndrome)    Fatigue    Goiter    HLD (hyperlipidemia)    HTN (hypertension)    Hypothyroid    Goiter   Prediabetes     Past Surgical History:  Procedure Laterality Date   COLONOSCOPY N/A 11/15/2015   Procedure: COLONOSCOPY;  Surgeon: West Bali, MD;  Location: [facility];  Service: Endoscopy;  Laterality: N/A;  9:30 Am   FOOT SURGERY     pinkie toe   TONSILLECTOMY      Current Outpatient Medications  Medication Sig Dispense Refill   levothyroxine (SYNTHROID) 75 MCG tablet Take 1 tablet (75 mcg total) by mouth every other day. 90 tablet 2   levothyroxine (SYNTHROID) 88 MCG tablet Take 1 tablet (88 mcg total) by mouth every other day. 90 tablet 2   Vitamin D, Ergocalciferol, (DRISDOL) 1.25 MG (50000 UNIT) CAPS capsule Take 1 capsule (50,000 Units total) by mouth every 14 (fourteen) days. 6 capsule 1   No current facility-administered medications for this  visit.    Family History  Problem Relation Age of Onset   Hypertension Father    Hyperlipidemia Father    Colon cancer Maternal Grandmother    Diabetes Paternal Grandmother    Thyroid disease Mother     ROS: Constitutional: negative Genitourinary:negative  Exam:   BP (!) 145/81 (BP Location: Right Arm, Patient Position: Sitting, Cuff Size: Large)   Pulse 71   Ht 5' 3.75" (1.619 m)   Wt 165 lb 12.8 oz (75.2 kg)   BMI 28.68 kg/m   Height: 5' 3.75" (161.9 cm)  General appearance: alert, cooperative and appears stated age Head: Normocephalic, without obvious abnormality, atraumatic Neck: no adenopathy, supple, symmetrical, trachea midline and thyroid normal to inspection and palpation Lungs: clear to auscultation bilaterally Breasts: normal appearance, no masses or tenderness Heart: regular rate and rhythm Abdomen: soft, non-tender; bowel sounds normal; no masses,  no organomegaly Extremities: extremities normal, atraumatic, no cyanosis or edema Skin: Skin color, texture, turgor normal. No rashes or lesions Lymph nodes: Cervical, supraclavicular, and axillary nodes normal. No abnormal inguinal nodes palpated Neurologic: Grossly normal   Pelvic: External genitalia:  no lesions              Urethra:  normal appearing urethra with no masses, tenderness or lesions  Bartholins and Skenes: normal                 Vagina: normal appearing vagina with normal color and no discharge, no lesions              Cervix: no lesions              Pap taken: No. Bimanual Exam:  Uterus:  normal size, contour, position, consistency, mobility, non-tender              Adnexa: normal adnexa and no mass, fullness, tenderness               Rectovaginal: Confirms               Anus:  normal sphincter tone, no lesions  Chaperone, Ina Homes, CMA, was present for exam.  Assessment/Plan: 1. Well woman exam with routine gynecological exam - Pap smear neg 2023.  Guidelines reviewed. -  Mammogram just done today. - Colonoscopy 2017, follow up 10 years - Bone mineral density 09/2019 - lab work done with PCP.  Seeing Dr. Dalbert Garnet  - vaccines reviewed/updated  2. Acquired hypothyroidism - will have thyroid testing done with Dr. Dalbert Garnet - levothyroxine (SYNTHROID) 88 MCG tablet; Take 1 tablet (88 mcg total) by mouth every other day.  Dispense: 90 tablet; Refill: 2 - levothyroxine (SYNTHROID) 75 MCG tablet; Take 1 tablet (75 mcg total) by mouth every other day.  Dispense: 90 tablet; Refill: 2  3. Type 2 diabetes mellitus without complication, without long-term current use of insulin (HCC) - declined medication.  Wants to work on weight loss.  4. Hyperlipidemia, unspecified hyperlipidemia type  5. Vitamin D deficiency

## 2022-12-25 ENCOUNTER — Ambulatory Visit (INDEPENDENT_AMBULATORY_CARE_PROVIDER_SITE_OTHER): Payer: BC Managed Care – PPO | Admitting: Family Medicine

## 2022-12-26 ENCOUNTER — Encounter (HOSPITAL_BASED_OUTPATIENT_CLINIC_OR_DEPARTMENT_OTHER): Payer: Self-pay | Admitting: Obstetrics & Gynecology

## 2023-01-07 DIAGNOSIS — R7309 Other abnormal glucose: Secondary | ICD-10-CM | POA: Diagnosis not present

## 2023-01-16 ENCOUNTER — Encounter (INDEPENDENT_AMBULATORY_CARE_PROVIDER_SITE_OTHER): Payer: Self-pay | Admitting: Family Medicine

## 2023-01-16 ENCOUNTER — Ambulatory Visit (INDEPENDENT_AMBULATORY_CARE_PROVIDER_SITE_OTHER): Payer: BC Managed Care – PPO | Admitting: Family Medicine

## 2023-01-16 VITALS — BP 126/78 | HR 61 | Temp 97.6°F | Ht 63.5 in | Wt 164.0 lb

## 2023-01-16 DIAGNOSIS — E785 Hyperlipidemia, unspecified: Secondary | ICD-10-CM

## 2023-01-16 DIAGNOSIS — R0602 Shortness of breath: Secondary | ICD-10-CM | POA: Diagnosis not present

## 2023-01-16 DIAGNOSIS — Z1331 Encounter for screening for depression: Secondary | ICD-10-CM | POA: Diagnosis not present

## 2023-01-16 DIAGNOSIS — R7303 Prediabetes: Secondary | ICD-10-CM | POA: Diagnosis not present

## 2023-01-16 DIAGNOSIS — R030 Elevated blood-pressure reading, without diagnosis of hypertension: Secondary | ICD-10-CM

## 2023-01-16 DIAGNOSIS — E66811 Obesity, class 1: Secondary | ICD-10-CM

## 2023-01-16 DIAGNOSIS — E038 Other specified hypothyroidism: Secondary | ICD-10-CM

## 2023-01-16 DIAGNOSIS — R5383 Other fatigue: Secondary | ICD-10-CM | POA: Diagnosis not present

## 2023-01-16 DIAGNOSIS — E559 Vitamin D deficiency, unspecified: Secondary | ICD-10-CM

## 2023-01-16 DIAGNOSIS — Z6828 Body mass index (BMI) 28.0-28.9, adult: Secondary | ICD-10-CM

## 2023-01-16 DIAGNOSIS — E669 Obesity, unspecified: Secondary | ICD-10-CM

## 2023-01-16 NOTE — Progress Notes (Incomplete)
Carlye Grippe, D.O.  ABFM, ABOM Specializing in Clinical Bariatric Medicine Office located at: [address] [place], [place]  [address]   Bariatric Medicine Visit  Dear Betsey Holiday, August Summer,*   Thank you for referring Naseem Makhoul to our clinic today for evaluation.  We performed a consultation to discuss her options for treatment and educate the patient on her disease state.  The following note includes my evaluation and treatment recommendations.   Please do not hesitate to reach out to me directly if you have any further concerns.   Assessment and Plan:   FOR THE DISEASE OF OBESITY  Recommended Dietary Goals Arielis is currently in the action stage of change. As such, her goal is to continue weight management plan.  She has agreed to: follow the CAT 1 MP with L options.   Behavioral Intervention We discussed the following Behavioral Modification Strategies today: begin to work on maintaining a reduced calorie state, getting the recommended amount of protein, incorporating whole foods, making healthy choices, staying well hydrated and practicing mindfulness when eating.  Additional resources provided today: handout on CAT 1 meal plan  and Handout on CAT 1-2 lunch options  Evidence-based interventions for health behavior change were utilized today including the discussion of self monitoring techniques, problem-solving barriers and SMART goal setting techniques.   Regarding patient's less desirable eating habits and patterns, we employed the technique of small changes.   Pt will specifically work on: n/a   Recommended Physical Activity Goals Jarrah has been advised to work up to 150 minutes of moderate intensity aerobic activity a week and strengthening exercises 2-3 times per week for cardiovascular health, weight loss maintenance and preservation of muscle mass.   She has agreed to : Increase physical activity in their day (e.g walking) and reduce  sedentary time (increase NEAT).   Pharmacotherapy We both agreed to : begin with nutritional and behavioral strategies   FOR ASSOCIATED CONDITIONS ADDRESSED TODAY:  Fatigue Assessment & Plan: Shantrell does feel that her weight is causing her energy to be lower than it should be. Fatigue may be related to obesity, depression or many other causes. she does not appear to have any red flag symptoms and this appears to most likely be related to her current lifestyle habits and dietary intake.  Labs will be ordered and reviewed with her at their next office visit in two weeks.  Epworth sleepiness scale score appears to be within normal limits.  Her ESS score is 4. Magdalene denies daytime somnolence and  sometimes  wakes up still tired. Alasia generally gets 8 hours of sleep per night, and states that she has generally restful sleep. Snoring is present. Apneic episodes are not present.   ECG: Performed and reviewed/ interpreted independently. Sinus brady cardia, rate 58 bpm; reassuring without any acute abnormalities, will continue to monitor for symptoms   Orders: -     CBC with Differential/Platelet -     Folate -     Vitamin B12 -     EKG 12-Lead   Shortness of breath on exertion Assessment & Plan: Lajoy does feel that she gets out of breath more easily than she used to when she exercises and seems to be worsening over time with weight gain.  This has gotten worse recently. Carrine denies shortness of breath at rest or orthopnea. [place]'s shortness of breath appears to be obesity related and exercise induced, as they do not appear to have any "red flag" symptoms/ concerns  today.  Also, this condition appears to be related to a state of poor cardiovascular conditioning   Obtain labs today and will be reviewed with her at their next office visit in two weeks.  Indirect Calorimeter completed today to help guide our dietary regimen. It shows a VO2 of 198 and a REE of 1368.  Her calculated basal  metabolic rate is 1610 thus her measured basal metabolic rate is worse than expected.  Patient agreed to work on weight loss at this time.  As Liese progresses through our weight loss program, we will gradually increase exercise as tolerated to treat her current condition.   If Abrish follows our recommendations and loses 5-10% of their weight without improvement of her shortness of breath or if at any time, symptoms become more concerning, they agree to urgently follow up with their PCP/ specialist for further consideration/ evaluation.   Nicholas verbalizes agreement with this plan.    Depression screen Assessment & Plan: Her Food and Mood (modified PHQ-9) score was 1. No concerns in this regard today.    Hyperlipidemia, unspecified hyperlipidemia type Assessment & Plan: Most recent Lipid Panel: Lab Results  Component Value Date   CHOL 204 (H) 12/19/2018   HDL 52 12/19/2018   LDLCALC 134 (H) 12/19/2018   TRIG 98 12/19/2018   CHOLHDL 3.8 07/05/2018   Pt with hx of elevated LDL. Pt endorses declining statin therapy, which her PCP recommended in the past. Pt prefers the diet/exercise approach. She will begin our treatment plan of a heart-heathy, meal plan that is low in saturated and trans fats.   Orders: -     Lipid Panel With LDL/HDL Ratio   Elevated blood pressure reading in office without diagnosis of hypertension Assessment & Plan: Last 3 blood pressure readings in our office are as follows: BP Readings from Last 3 Encounters:  01/16/23 126/78  12/18/22 (!) 145/81  12/08/21 (!) 143/71   Pt's blood pressure has been elevated in her past 3 doctors visits. Today it was also elevated at 149/78 - upon recheck by CMA it was 126/78.   Dicussed that goal BP is less than 140/90 on a regular basis. Pt instructed to check blood pressure 3 days a week at arbitrary times. Lifestyle changes such as beginning our low salt, heart healthy meal plan were stressed.    Prediabetes Assessment  & Plan: Most recent Hemoglobin A1c and fasting insulin:  Lab Results  Component Value Date   HGBA1C 6.4 (H) 08/10/2020   HGBA1C 5.9 (H) 12/19/2018   HGBA1C 6.3 (H) 09/10/2018   INSULIN 10.0 12/19/2018   INSULIN 9.9 09/10/2018    No current meds. Diet/exercise approach. She endorses being diagnosed with prediabetes roughly 4-5 years ago at our clinic. She will begin to decrease simple carbs/ sugars; increase fiber and proteins -> follow her meal plan.   Orders: -     Comprehensive metabolic panel -     Hemoglobin A1c -     Insulin, random   Vitamin D deficiency Assessment & Plan: Most recent vitamin D: Lab Results  Component Value Date   VD25OH 41.2 08/10/2020   VD25OH 70.9 12/19/2018   VD25OH 38.8 09/10/2018   Per pt, she was diagnosed with Vitamin D deficiency at our clinic roughly 4 years ago. She has been off any Vitamin D supplementation in the past 3 mos. Begin weight loss efforts. Will recheck labs today.  Orders: -     VITAMIN D 25 Hydroxy (Vit-D Deficiency, Fractures)  Other specified hypothyroidism Assessment & Plan: Condition is controlled on Synthroid. She will continue with medication at current dose per Dr.Miller.   Orders: -     T4, free -     TSH   Obesity (BMI 30.0-34.9) 09/10/18 starting bmi 30.11 Assessment & Plan: Pt has a hx of obesity. Her BMI was elevated at 30.11 when she was in the program on 09/10/2018. She currently meets the criteria for obesity due to her elevated body fat percentage of 37%. She will begin weight loss therapy via her prudent nutritional plan.    FOLLOW UP:   Follow up in 2 weeks. She was informed of the importance of frequent follow up visits to maximize her success with intensive lifestyle modifications for her multiple health conditions.  Taleeah Daughety is aware that we will review all of her lab results at our next visit.  She is aware that if anything is critical/ life threatening with the results, we will be  contacting her via MyChart prior to the office visit to discuss management.    Chief Complaint:   OBESITY Brianah Bava (MR# [id number]) is a pleasant 62 y.o. female who presents for evaluation and treatment of obesity and related comorbidities. Current BMI is Body mass index is 28.6 kg/m. Glorene Stickel-Orlikowski has been struggling with her weight for many years and has been unsuccessful in either losing weight, maintaining weight loss, or reaching her healthy weight goal.  Wally Legerski is currently in the action stage of change and ready to dedicate time achieving and maintaining a healthier weight. Moraima Arbogast is interested in becoming our patient and working on intensive lifestyle modifications including (but not limited to) diet and exercise for weight loss.  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 Patient is married to Afton  and lives with him.  Has never tried a formal diet plan in the past, apart from our program.   Does not do any formal exercise.   Has no desired weight.   She desires to lose weight to improve her sugars, cholesterol, and blood pressure.   She eats outside the home 1x a week  She enjoys cooking.   Food dislikes, food she craves, and foods she snacks on : pt did not answer.   Liquid calories: 1 coffee a day with creamer  Does not identify any worst food habits.  Denies any emotional eating.   Subjective:   This is the patient's first visit at [facility].  The patient's NEW PATIENT PACKET that they filled out prior to today's office visit was reviewed at length and information from that paperwork was included within the following office visit note.    Included in the packet: current and past health history, medications, allergies, ROS, gynecologic history (women only), surgical history, family history, social history, weight history, weight loss surgery history (for those that have  had weight loss surgery), nutritional evaluation, mood and food questionnaire along with a depression screening (PHQ9) on all patients, an Epworth questionnaire, sleep habits questionnaire, patient life and health improvement goals questionnaire. These will all be scanned into the patient's chart under the "media" tab.   Review of Systems: Please refer to new patient packet scanned into media. Pertinent positives were addressed with patient today.  Reviewed by clinician on day of visit: allergies, medications, problem list, medical history, surgical history, family history, social history, and previous encounter notes.  During the visit, I independently reviewed the patient's EKG, bioimpedance scale results, and  indirect calorimeter results. I used this information to tailor a meal plan for the patient that will help Sebrena Stickel-Orlikowski to lose weight and will improve her obesity-related conditions going forward.  I performed a medically necessary appropriate examination and/or evaluation. I discussed the assessment and treatment plan with the patient. The patient was provided an opportunity to ask questions and all were answered. The patient agreed with the plan and demonstrated an understanding of the instructions. Labs were ordered today (unless patient declined them) and will be reviewed with the patient at our next visit unless more critical results need to be addressed immediately. Clinical information was updated and documented in the EMR.    Objective:   PHYSICAL EXAM: Blood pressure (!) 149/78, pulse 61, temperature 97.6 F (36.4 C), height 5' 3.5" (1.613 m), weight 164 lb (74.4 kg), SpO2 99%. Body mass index is 28.6 kg/m.  General: Well Developed, well nourished, and in no acute distress.  HEENT: Normocephalic, atraumatic; EOMI, sclerae are anicteric. Skin: Warm and dry, good turgor Chest:  Normal excursion, shape, no gross ABN Respiratory: No conversational dyspnea; speaking in  full sentences NeuroM-Sk:  Normal gross ROM * 4 extremities  Psych: A and O *3, insight adequate, mood- full   Anthropometric Measurements Height: 5' 3.5" (1.613 m) Weight: 164 lb (74.4 kg) BMI (Calculated): 28.59 Weight at Last Visit: na Weight Lost Since Last Visit: na Weight Gained Since Last Visit: na Starting Weight: 164lb Total Weight Loss (lbs): 0 lb (0 kg) Peak Weight: 170lb Waist Measurement : 38 inches   Body Composition  Body Fat %: 37 % Fat Mass (lbs): 60.8 lbs Muscle Mass (lbs): 98.4 lbs Total Body Water (lbs): 67.6 lbs Visceral Fat Rating : 10   Other Clinical Data RMR: 1368 Fasting: yes Labs: yes Today's Visit #: 1 Starting Date: 01/16/23 Comments: first visit    DIAGNOSTIC DATA REVIEWED:  BMET    Component Value Date/Time   NA 141 08/10/2020 1157   K 4.5 08/10/2020 1157   CL 102 08/10/2020 1157   CO2 26 08/10/2020 1157   GLUCOSE 114 (H) 08/10/2020 1157   GLUCOSE 96 12/09/2015 1131   BUN 11 08/10/2020 1157   CREATININE 0.77 08/10/2020 1157   CREATININE 0.79 12/09/2015 1131   CALCIUM 10.0 08/10/2020 1157   GFRNONAA 87 12/19/2018 0829   GFRAA 100 12/19/2018 0829   Lab Results  Component Value Date   HGBA1C 6.4 (H) 08/10/2020   HGBA1C 6.2 (H) 06/12/2017   Lab Results  Component Value Date   INSULIN 10.0 12/19/2018   INSULIN 9.9 09/10/2018   Lab Results  Component Value Date   TSH 0.335 (L) 08/10/2020   CBC    Component Value Date/Time   WBC 5.2 08/10/2020 1157   WBC 6.3 12/09/2015 1131   RBC 5.33 (H) 08/10/2020 1157   RBC 5.11 (H) 12/09/2015 1131   HGB 14.8 08/10/2020 1157   HGB 13.6 12/09/2015 1042   HCT 44.9 08/10/2020 1157   PLT 233 08/10/2020 1157   MCV 84 08/10/2020 1157   MCH 27.8 08/10/2020 1157   MCH 28.6 12/09/2015 1131   MCHC 33.0 08/10/2020 1157   MCHC 33.2 12/09/2015 1131   RDW 13.2 08/10/2020 1157   Iron Studies    Component Value Date/Time   FERRITIN 15 04/30/2014 1711   Lipid Panel     Component  Value Date/Time   CHOL 204 (H) 12/19/2018 0829   TRIG 98 12/19/2018 0829   HDL 52 12/19/2018 0829   CHOLHDL  3.8 07/05/2018 0942   CHOLHDL 3.8 12/09/2015 1131   VLDL 24 12/09/2015 1131   LDLCALC 134 (H) 12/19/2018 0829   Hepatic Function Panel     Component Value Date/Time   PROT 7.3 08/10/2020 1157   ALBUMIN 4.8 08/10/2020 1157   AST 24 08/10/2020 1157   ALT 25 08/10/2020 1157   ALKPHOS 69 08/10/2020 1157   BILITOT 0.9 08/10/2020 1157      Component Value Date/Time   TSH 0.335 (L) 08/10/2020 1157   Nutritional Lab Results  Component Value Date   VD25OH 41.2 08/10/2020   VD25OH 70.9 12/19/2018   VD25OH 38.8 09/10/2018    Attestation Statements:   I, Special Puri, acting as a Stage manager for [employer], DO., have compiled all relevant documentation for today's office visit on behalf of Thomasene Lot, DO, while in the presence of [employer], DO.  Reviewed by clinician on day of visit: allergies, medications, problem list, medical history, surgical history, family history, social history, and previous encounter notes pertinent to patient's obesity diagnosis. I have spent 50  minutes in the care of the patient today including: preparing to see patient (e.g. review and interpretation of tests, old notes ), obtaining and/or reviewing separately obtained history, performing a medically appropriate examination or evaluation, counseling and educating the patient, ordering medications, test or procedures, documenting clinical information in the electronic or other health care record, and independently interpreting results and communicating results to the patient, family, or caregiver   I have reviewed the above documentation for accuracy and completeness, and I agree with the above. Carlye Grippe, D.O.  The 21st Century Cures Act was signed into law in 2016 which includes the topic of electronic health records.  This provides immediate access to information in  MyChart.  This includes consultation notes, operative notes, office notes, lab results and pathology reports.  If you have any questions about what you read please let us know at your next visit so we can discuss your concerns and take corrective action if need be.  We are right here with you.

## 2023-01-18 LAB — CBC WITH DIFFERENTIAL/PLATELET
Basophils Absolute: 0.1 10*3/uL (ref 0.0–0.2)
Basos: 2 %
EOS (ABSOLUTE): 0.1 10*3/uL (ref 0.0–0.4)
Eos: 2 %
Hematocrit: 45.2 % (ref 34.0–46.6)
Hemoglobin: 14.5 g/dL (ref 11.1–15.9)
Immature Grans (Abs): 0 10*3/uL (ref 0.0–0.1)
Immature Granulocytes: 0 %
Lymphocytes Absolute: 2.1 10*3/uL (ref 0.7–3.1)
Lymphs: 43 %
MCH: 27.4 pg (ref 26.6–33.0)
MCHC: 32.1 g/dL (ref 31.5–35.7)
MCV: 85 fL (ref 79–97)
Monocytes Absolute: 0.4 10*3/uL (ref 0.1–0.9)
Monocytes: 8 %
Neutrophils Absolute: 2.2 10*3/uL (ref 1.4–7.0)
Neutrophils: 45 %
Platelets: 224 10*3/uL (ref 150–450)
RBC: 5.29 x10E6/uL — ABNORMAL HIGH (ref 3.77–5.28)
RDW: 13.1 % (ref 11.7–15.4)
WBC: 4.8 10*3/uL (ref 3.4–10.8)

## 2023-01-18 LAB — COMPREHENSIVE METABOLIC PANEL
ALT: 15 [IU]/L (ref 0–32)
AST: 17 [IU]/L (ref 0–40)
Albumin: 4.7 g/dL (ref 3.9–4.9)
Alkaline Phosphatase: 69 [IU]/L (ref 44–121)
BUN/Creatinine Ratio: 20 (ref 12–28)
BUN: 16 mg/dL (ref 8–27)
Bilirubin Total: 0.9 mg/dL (ref 0.0–1.2)
CO2: 25 mmol/L (ref 20–29)
Calcium: 9.6 mg/dL (ref 8.7–10.3)
Chloride: 100 mmol/L (ref 96–106)
Creatinine, Ser: 0.81 mg/dL (ref 0.57–1.00)
Globulin, Total: 2.6 g/dL (ref 1.5–4.5)
Glucose: 108 mg/dL — ABNORMAL HIGH (ref 70–99)
Potassium: 4.1 mmol/L (ref 3.5–5.2)
Sodium: 141 mmol/L (ref 134–144)
Total Protein: 7.3 g/dL (ref 6.0–8.5)
eGFR: 81 mL/min/{1.73_m2} (ref >59)

## 2023-01-18 LAB — LIPID PANEL WITH LDL/HDL RATIO
Cholesterol, Total: 246 mg/dL — ABNORMAL HIGH (ref 100–199)
HDL: 68 mg/dL (ref >39)
LDL Chol Calc (NIH): 163 mg/dL — ABNORMAL HIGH (ref 0–99)
LDL/HDL Ratio: 2.4 {ratio} (ref 0.0–3.2)
Triglycerides: 85 mg/dL (ref 0–149)
VLDL Cholesterol Cal: 15 mg/dL (ref 5–40)

## 2023-01-18 LAB — T4, FREE: Free T4: 1.42 ng/dL (ref 0.82–1.77)

## 2023-01-18 LAB — HEMOGLOBIN A1C
Est. average glucose Bld gHb Est-mCnc: 140 mg/dL
Hgb A1c MFr Bld: 6.5 % — ABNORMAL HIGH (ref 4.8–5.6)

## 2023-01-18 LAB — VITAMIN B12: Vitamin B-12: 627 pg/mL (ref 232–1245)

## 2023-01-18 LAB — TSH: TSH: 1.92 u[IU]/mL (ref 0.450–4.500)

## 2023-01-18 LAB — VITAMIN D 25 HYDROXY (VIT D DEFICIENCY, FRACTURES): Vit D, 25-Hydroxy: 33.1 ng/mL (ref 30.0–100.0)

## 2023-01-18 LAB — FOLATE: Folate: 13.2 ng/mL (ref >3.0)

## 2023-01-18 LAB — INSULIN, RANDOM: INSULIN: 10.1 u[IU]/mL (ref 2.6–24.9)

## 2023-01-29 ENCOUNTER — Ambulatory Visit (INDEPENDENT_AMBULATORY_CARE_PROVIDER_SITE_OTHER): Payer: Self-pay | Admitting: Family Medicine

## 2023-02-01 ENCOUNTER — Encounter (INDEPENDENT_AMBULATORY_CARE_PROVIDER_SITE_OTHER): Payer: Self-pay | Admitting: Family Medicine

## 2023-02-01 ENCOUNTER — Ambulatory Visit (INDEPENDENT_AMBULATORY_CARE_PROVIDER_SITE_OTHER): Payer: Self-pay | Admitting: Family Medicine

## 2023-02-01 VITALS — BP 130/78 | HR 63 | Temp 97.8°F | Ht 63.5 in | Wt 159.0 lb

## 2023-02-01 DIAGNOSIS — Z6827 Body mass index (BMI) 27.0-27.9, adult: Secondary | ICD-10-CM

## 2023-02-01 DIAGNOSIS — R030 Elevated blood-pressure reading, without diagnosis of hypertension: Secondary | ICD-10-CM | POA: Diagnosis not present

## 2023-02-01 DIAGNOSIS — E119 Type 2 diabetes mellitus without complications: Secondary | ICD-10-CM

## 2023-02-01 DIAGNOSIS — E66811 Obesity, class 1: Secondary | ICD-10-CM

## 2023-02-01 DIAGNOSIS — E559 Vitamin D deficiency, unspecified: Secondary | ICD-10-CM

## 2023-02-01 DIAGNOSIS — E7800 Pure hypercholesterolemia, unspecified: Secondary | ICD-10-CM

## 2023-02-01 DIAGNOSIS — Z794 Long term (current) use of insulin: Secondary | ICD-10-CM

## 2023-02-01 DIAGNOSIS — E669 Obesity, unspecified: Secondary | ICD-10-CM

## 2023-02-01 NOTE — Progress Notes (Signed)
.sm[facility]  Office: [phone]  /  Fax: [phone]  WEIGHT SUMMARY AND BIOMETRICS  Anthropometric Measurements Height: 5' 3.5" (1.613 m) Weight: 159 lb (72.1 kg) BMI (Calculated): 27.72 Weight at Last Visit: 164 lb Weight Lost Since Last Visit: 5 lb Weight Gained Since Last Visit: 159 lb Starting Weight: 164 lb Total Weight Loss (lbs): 5 lb (2.268 kg) Peak Weight: 170 lb   Body Composition  Body Fat %: 35.5 % Fat Mass (lbs): 56.6 lbs Muscle Mass (lbs): 97.8 lbs Total Body Water (lbs): 67.2 lbs Visceral Fat Rating : 9   Other Clinical Data Fasting: No Labs: No Today's Visit #: 2 Starting Date: 01/16/23    Chief Complaint: OBESITY   Discussed the use of AI scribe software for clinical note transcription with the patient, who gave verbal consent to proceed.  History of Present Illness   The patient, with a history of hypothyroidism, presents for a follow-up visit to review recent lab results and discuss ongoing weight loss efforts. The patient has been adhering to a prescribed eating plan approximately 75% of the time and has successfully lost five pounds, even during the holiday season. However, she reports no current exercise regimen.  Lab results reveal elevated LDL cholesterol, low vitamin D levels, and a hemoglobin A1c of 6.5, indicating a new diagnosis of type 2 diabetes. The patient's TSH levels are normal, indicating well-controlled hypothyroidism on levothyroxine. Kidney and liver function tests are within normal limits.  The patient's blood pressure readings during the visit were elevated, with readings of 171/94 and 167/108, despite no history of hypertension. The patient expressed concern about these readings and agreed to monitor her blood pressure at home.  The patient also expressed concern about the results of a recent EKG, which she interpreted as abnormal. However, the patient did not provide specific details about the EKG findings during the  conversation.  The patient has a family history of prediabetes, with her father having been prediabetic for several years. 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 This caregiving role, combined with her full-time job, contributes to her overall stress levels and may impact her health management.          PHYSICAL EXAM:  Blood pressure (!) 171/94, pulse 63, temperature 97.8 F (36.6 C), height 5' 3.5" (1.613 m), weight 159 lb (72.1 kg), SpO2 99%. Body mass index is 27.72 kg/m.  DIAGNOSTIC DATA REVIEWED:  BMET    Component Value Date/Time   NA 141 01/16/2023 1022   K 4.1 01/16/2023 1022   CL 100 01/16/2023 1022   CO2 25 01/16/2023 1022   GLUCOSE 108 (H) 01/16/2023 1022   GLUCOSE 96 12/09/2015 1131   BUN 16 01/16/2023 1022   CREATININE 0.81 01/16/2023 1022   CREATININE 0.79 12/09/2015 1131   CALCIUM 9.6 01/16/2023 1022   GFRNONAA 87 12/19/2018 0829   GFRAA 100 12/19/2018 0829   Lab Results  Component Value Date   HGBA1C 6.5 (H) 01/16/2023   HGBA1C 6.2 (H) 06/12/2017   Lab Results  Component Value Date   INSULIN 10.1 01/16/2023   INSULIN 9.9 09/10/2018   Lab Results  Component Value Date   TSH 1.920 01/16/2023   CBC    Component Value Date/Time   WBC 4.8 01/16/2023 1022   WBC 6.3 12/09/2015 1131   RBC 5.29 (H) 01/16/2023 1022   RBC 5.11 (H) 12/09/2015 1131   HGB 14.5 01/16/2023 1022  HGB 13.6 12/09/2015 1042   HCT 45.2 01/16/2023 1022   PLT 224 01/16/2023 1022   MCV 85 01/16/2023 1022   MCH 27.4 01/16/2023 1022   MCH 28.6 12/09/2015 1131   MCHC 32.1 01/16/2023 1022   MCHC 33.2 12/09/2015 1131   RDW 13.1 01/16/2023 1022   Iron Studies    Component Value Date/Time   FERRITIN 15 04/30/2014 1711   Lipid Panel     Component Value Date/Time   CHOL 246 (H) 01/16/2023 1022   TRIG 85 01/16/2023 1022   HDL 68 01/16/2023 1022   CHOLHDL 3.8 07/05/2018 0942    CHOLHDL 3.8 12/09/2015 1131   VLDL 24 12/09/2015 1131   LDLCALC 163 (H) 01/16/2023 1022   Hepatic Function Panel     Component Value Date/Time   PROT 7.3 01/16/2023 1022   ALBUMIN 4.7 01/16/2023 1022   AST 17 01/16/2023 1022   ALT 15 01/16/2023 1022   ALKPHOS 69 01/16/2023 1022   BILITOT 0.9 01/16/2023 1022      Component Value Date/Time   TSH 1.920 01/16/2023 1022   Nutritional Lab Results  Component Value Date   VD25OH 33.1 01/16/2023   VD25OH 41.2 08/10/2020   VD25OH 70.9 12/19/2018     Assessment and Plan    Type 2 Diabetes Mellitus New diagnosis with hemoglobin A1c of 6.5%. Discussed the importance of controlling blood glucose to prevent complications such as cardiovascular disease. Emphasized dietary management, particularly reducing simple carbohydrates and increasing protein intake. Explained potential for remission with proper management but clarified ongoing monitoring. Discussed genetic component and importance of exercise. - Follow category one eating plan with one small tablespoon of peanut butter. - Provide pescetarian dietary plan as an alternative. - Recheck hemoglobin A1c in three months.  Elevated Blood Pressure Elevated readings (171/94 and 167/108) with no history of hypertension. Discussed potential need for medication if persistent. Advised home monitoring and explained transient elevation may resolve with weight loss and dietary changes. - Monitor blood pressure at home twice a week. - Record readings and bring to next appointment. - Recheck blood pressure in three months.  Pure Hyperlipidemia Elevated LDL cholesterol. Discussed dietary impact, particularly reducing cheese intake. Explained benefits of reducing animal fats and increasing plant fats. Not currently on statins. - Continue dietary modifications to reduce LDL cholesterol. - Recheck lipid panel in three months.  Vitamin D Deficiency Low vitamin D level at 33.1 ng/mL. Patient reports  fatigue possibly related to deficiency. Discussed importance of supplementation. - Prescribe vitamin D 50,000 IU weekly. - Recheck vitamin D level in three months.  Hypothyroidism Well-managed on alternating doses of levothyroxine (75 mcg and 88 mcg). - Continue current levothyroxine regimen.  General Health Maintenance Discussed importance of regular exercise for managing blood glucose and overall health. Patient reports being active at work and plans to continue walking during lunch breaks. - Encourage daily physical activity, including walking during lunch breaks.  Follow-up - Schedule follow-up appointment in 2-3 weeks - Recheck labs including hemoglobin A1c, lipid panel, and vitamin D level in three months.         I have personally spent 40 minutes total time today in preparation, patient care, and documentation for this visit, including the following: review of clinical lab tests; review of medical tests/procedures/services.    She was informed of the importance of frequent follow up visits to maximize her success with intensive lifestyle modifications for her multiple health conditions.    Quillian Quince, MD

## 2023-02-07 DIAGNOSIS — R7309 Other abnormal glucose: Secondary | ICD-10-CM | POA: Diagnosis not present

## 2023-02-25 NOTE — Progress Notes (Unsigned)
SUBJECTIVE: Discussed the use of AI scribe software for clinical note transcription with the patient, who gave verbal consent to proceed.  Chief Complaint: Obesity  Interim History: She is down 5 lbs since her last visit.  Down 10 lbs overall since 01/16/23.  TBW loss of 6.1%  Deborah Miranda is a 63 year old female with a recent diagnosis of type 2 diabetes and vitamin D deficiency, who is currently on a weight loss regimen for obesity. Since initiating the pescetarian diet plan in December 2024, the patient has lost 10 pounds, representing a 6.1% total body weight loss. The patient adheres to the diet plan about 90% of the time and engages in 30-40 minutes of walking five days a week.  The patient is also concerned about meeting daily nutritional requirements, and we discussed targeting 1100 calories and 75 grams of protein. She has incorporated a handful of nuts into her diet and is considering adding beans and high-protein pasta. 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  The patient's blood pressure was noted to be slightly elevated during the visit, which she attributes to the discomfort experienced during the measurement process. She also reported a recent diagnosis of diabetes after years of being prediabetic, which she is keen to manage through diet and lifestyle changes rather than medication. The patient's cholesterol levels were also reported to be elevated.  In addition to these health concerns, the patient is under significant stress due to her role as the primary caregiver for her husband. She has expressed concerns about managing her own health while also providing care for her spouse. Despite these challenges, the patient is committed to continuing her weight loss journey and improving her overall health.  Deborah Miranda is here to discuss her progress with her obesity  treatment plan. She is on the [employer] and states she is following her eating plan approximately 90 % of the time. She states she is exercising walking 35-40 minutes 5 times per week.   OBJECTIVE: Visit Diagnoses: Problem List Items Addressed This Visit     Obesity (BMI 30.0-34.9)   Diabetes (HCC) - Primary   Vitamin D deficiency   Relevant Medications   Vitamin D, Ergocalciferol, (DRISDOL) 1.25 MG (50000 UNIT) CAPS capsule   Elevated blood pressure reading in office without diagnosis of hypertension   Other Visit Diagnoses       Hyperlipidemia associated with type 2 diabetes mellitus (HCC)         BMI 26.0-26.9,adult Current BMI 26.9         Obesity Following a pescetarian diet and has lost 10 pounds since January 16, 2023 (6.1% body weight loss). Walking 30-40 minutes five days per week. Advised to consume 1100 calories and 75+ grams of protein per day. Managing diet well but advised to journal food intake if possible. Discussed incorporating vegetarian options like beans and lentils and ensuring necessary food items at home. - Continue pescetarian diet - Consume ~1100 calories and 75 grams of protein per day - Okay to Incorporate vegetarian options like beans and lentils - Consider food journaling if possible in the future. Patient feels would be overwhelming at this point.   Type 2 Diabetes Mellitus Lab Results  Component Value Date   HGBA1C 6.5 (H) 01/16/2023   HGBA1C 6.4 (H) 08/10/2020   HGBA1C 5.9 (H) 12/19/2018   Lab Results  Component Value Date   LDLCALC 163 (H)  01/16/2023   CREATININE 0.81 01/16/2023    Recently diagnosed with type 2 diabetes. Prefers managing condition through diet and exercise due to concerns about long-term effects of medications. Advised that weight loss and dietary control can potentially lead to remission. Discussed newer medications like injectables if dietary control becomes insufficient. - Continue current diet and exercise  regimen - Monitor A1c levels - Follow up labs in three months from last labs.  She is working  on nutrition plan to decrease simple carbohydrates, increase lean proteins and exercise to promote weight loss and improve glycemic control .   Elevated BP without diagnosis of hypertension Blood pressure elevated during visit but often normalizes. Concerned about accuracy of readings due to cuff discomfort. Prefers to avoid medication if possible. - Recheck blood pressure before leaving clinic- BP 149/77 - Monitor blood pressure regularly at home over the next couple of weeks.  Consider starting medication if remains consistently elevated.  She does not use salt in cooking and we discussed monitoring salt/sodium in any prepared foods as well.   Hyperlipidemia The 10-year ASCVD risk score (Arnett DK, et al., 2019) is: 11.9%  Elevated cholesterol levels despite not consuming red meat. Preference for cheese may be contributing. Advised to balance diet with legumes and vegetables. - Continue current diet with focus on legumes and vegetables- increased dietary fiber.  - Monitor cholesterol levels - Follow up labs in three months for reassessment Continue to work on nutrition plan -decreasing simple carbohydrates, increasing lean proteins, decreasing saturated fats and cholesterol , avoiding trans fats and exercise as able to promote weight loss, improve lipids and decrease cardiovascular risks.  Vitamin D Deficiency Vitamin D is not at goal of 50.  Most recent vitamin D level was 33.1. She is on  prescription ergocalciferol 50,000 IU weekly. No N/V or muscle weakness with Ergocalciferol.   Lab Results  Component Value Date   VD25OH 33.1 01/16/2023   VD25OH 41.2 08/10/2020   VD25OH 70.9 12/19/2018    Plan: Continue and refill  prescription ergocalciferol 50,000 IU weekly Low vitamin D levels can be associated with adiposity and may result in leptin resistance and weight gain. Also associated  with fatigue. Currently on vitamin D supplementation without any adverse effects.  Recheck vitamin D level in 3 months to optimize supplementation/avoid over supplementation.   General Health Maintenance Advised on dietary adjustments and stress management techniques. Discussed using a notepad to manage nighttime stress and improve sleep quality. Discussed magnesium supplementation for sleep. - Use a notepad to manage nighttime stress - Continue magnesium supplementation for sleep  Follow-up - Follow up on March 26, 2023, with Dr. Sharee Holster - Follow up in March 2025, appointment scheduled for 10:30 AM.  Vitals Temp: 98.3 F (36.8 C) BP: (!) 143/84 (left) Pulse Rate: 64 SpO2: 100 %   Anthropometric Measurements Height: 5' 3.5" (1.613 m) Weight: 154 lb (69.9 kg) BMI (Calculated): 26.85 Weight at Last Visit: 159 lb Weight Lost Since Last Visit: 5 lb Weight Gained Since Last Visit: 0 Starting Weight: 164 lb Total Weight Loss (lbs): 5 lb (2.268 kg) Peak Weight: 170 lb   Body Composition  Body Fat %: 34.7 % Fat Mass (lbs): 53.6 lbs Muscle Mass (lbs): 95.4 lbs Total Body Water (lbs): 66.8 lbs Visceral Fat Rating : 9   Other Clinical Data Fasting: NO Labs: No Today's Visit #: 3 Starting Date: 01/16/23     ASSESSMENT AND PLAN:  Diet: Deborah Miranda is currently in the action stage of change. As such,  her goal is to continue with weight loss efforts. She has agreed to [employer].  Exercise: Deborah Miranda has been instructed to continue exercising as is for weight loss and overall health benefits.   Behavior Modification:  We discussed the following Behavioral Modification Strategies today: increasing lean protein intake, decreasing simple carbohydrates, increasing vegetables, increase H2O intake, increase high fiber foods, no skipping meals, avoiding temptations, and planning for success. We discussed various medication options to help Deborah Miranda with her weight loss efforts and  we both agreed to continue to work on nutritional and behavioral strategies to promote weight loss.  .  Return in about 4 weeks (around 03/26/2023).[name] She was informed of the importance of frequent follow up visits to maximize her success with intensive lifestyle modifications for her multiple health conditions.  Attestation Statements:   Reviewed by clinician on day of visit: allergies, medications, problem list, medical history, surgical history, family history, social history, and previous encounter notes.   Time spent on visit including pre-visit chart review and post-visit care and charting was 37 minutes.    Grayton Lobo, PA-C

## 2023-02-26 ENCOUNTER — Encounter (INDEPENDENT_AMBULATORY_CARE_PROVIDER_SITE_OTHER): Payer: Self-pay | Admitting: Physician Assistant

## 2023-02-26 ENCOUNTER — Ambulatory Visit (INDEPENDENT_AMBULATORY_CARE_PROVIDER_SITE_OTHER): Payer: BC Managed Care – PPO | Admitting: Physician Assistant

## 2023-02-26 VITALS — BP 143/84 | HR 64 | Temp 98.3°F | Ht 63.5 in | Wt 154.0 lb

## 2023-02-26 DIAGNOSIS — R030 Elevated blood-pressure reading, without diagnosis of hypertension: Secondary | ICD-10-CM | POA: Diagnosis not present

## 2023-02-26 DIAGNOSIS — E559 Vitamin D deficiency, unspecified: Secondary | ICD-10-CM

## 2023-02-26 DIAGNOSIS — E785 Hyperlipidemia, unspecified: Secondary | ICD-10-CM

## 2023-02-26 DIAGNOSIS — Z6826 Body mass index (BMI) 26.0-26.9, adult: Secondary | ICD-10-CM

## 2023-02-26 DIAGNOSIS — E1169 Type 2 diabetes mellitus with other specified complication: Secondary | ICD-10-CM | POA: Diagnosis not present

## 2023-02-26 DIAGNOSIS — E66811 Obesity, class 1: Secondary | ICD-10-CM

## 2023-02-26 DIAGNOSIS — Z794 Long term (current) use of insulin: Secondary | ICD-10-CM

## 2023-02-26 MED ORDER — VITAMIN D (ERGOCALCIFEROL) 1.25 MG (50000 UNIT) PO CAPS: 50000.0000 [IU] | ORAL_CAPSULE | ORAL | 0 refills | Status: DC

## 2023-03-26 ENCOUNTER — Ambulatory Visit (INDEPENDENT_AMBULATORY_CARE_PROVIDER_SITE_OTHER): Payer: BC Managed Care – PPO | Admitting: Family Medicine

## 2023-03-27 ENCOUNTER — Other Ambulatory Visit (INDEPENDENT_AMBULATORY_CARE_PROVIDER_SITE_OTHER): Payer: Self-pay | Admitting: Physician Assistant

## 2023-03-27 DIAGNOSIS — E559 Vitamin D deficiency, unspecified: Secondary | ICD-10-CM

## 2023-04-26 NOTE — Progress Notes (Signed)
 SUBJECTIVE: Discussed the use of AI scribe software for clinical note transcription with the patient, who gave verbal consent to proceed.  Chief Complaint: Obesity  Interim History: She is down 4 lbs since her last visit. Down 14 lbs overall TBW loss of 8.54%  Deborah Miranda is here to discuss her progress with her obesity treatment plan. She is on the [employer] and states she is following her eating plan approximately 75 % of the time. She states she is exercising 30 minutes 5 times per week.  Deborah Miranda is a 63 year old female who presents for follow-up of her obesity treatment plan.  She has made progress in her obesity treatment plan, losing 5.2 pounds of adipose mass and gaining 1.2 pounds of muscle mass since her last visit. Her body adipose percentage is now 32.2%. She attributes her success to being mindful of her meals, particularly breakfast and lunch, while at work.  However, she struggles with maintaining her diet during dinner due to her husband's dietary preferences and her hunger upon returning home from work.  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 She estimates adherence to her diet plan about 75% of the time until dinner. To manage her hunger, we discussed incorporating a small snack like string cheese or other option on her way home from work. She consumes yogurt with fruit and chia seeds for breakfast and occasionally uses yogurt as a dessert. She is cautious about sugar intake and avoids snacks like ice cream to prevent sugar cravings. She enjoys nuts such as walnuts, almonds, and pecans but is mindful of portion sizes.  She has a history of type 2 diabetes, hyperlipidemia, vitamin D deficiency, and hypothyroidism. She is motivated to improve her health and aims to not be diabetic by her next blood work. Her last blood work was  done in December, and we plan to have it repeated in May. She is also monitoring her blood pressure, which was noted to be lower today. No current illness and is trying to avoid getting sick.  She is the primary caregiver for her husband, who is 66 years old and has Parkinson's disease. She finds it challenging to balance her responsibilities at home and work, noting that work serves as a break for her. She is considering respite care to help manage her husband's increasing needs.  OBJECTIVE: Visit Diagnoses: Problem List Items Addressed This Visit     Obesity (BMI 30.0-34.9)   Diabetes (HCC) - Primary   Vitamin D deficiency   Relevant Medications   Vitamin D, Ergocalciferol, (DRISDOL) 1.25 MG (50000 UNIT) CAPS capsule   Other Visit Diagnoses       Hyperlipidemia associated with type 2 diabetes mellitus (HCC)         BMI 26.0-26.9,adult Current BMI 26.2          Obesity Deborah Miranda has successfully lost 5.2 pounds of fat and gained 1.2 pounds of muscle, reducing her body fat percentage to 32.2%, below the target of 35%. She adheres to her diet during breakfast and lunch but struggles with dinner due to her husband's dietary preferences.  Strategies to mitigate hunger before dinner were discussed, including having a snack and drinking water.  The importance of maintaining muscle mass for better health outcomes was emphasized. - Continue current dietary plan with emphasis on mindful eating during dinner. - Incorporate a snack on the way  home from work to reduce hunger before dinner. Provided 100 calorie snack options handout - Drink water on the way home to help with satiety. - Consider using plain [place] yogurt with ranch dip and vegetables as a snack while cooking. - Monitor body composition and weight loss progress.  Type 2 Diabetes Mellitus Deborah Miranda aims to improve blood glucose control and potentially reverse her diabetes diagnosis. She is motivated to achieve better  blood work results and has been making dietary changes to reduce sugar intake.   Lab Results  Component Value Date   HGBA1C 6.5 (H) 01/16/2023   HGBA1C 6.4 (H) 08/10/2020   HGBA1C 5.9 (H) 12/19/2018   Lab Results  Component Value Date   LDLCALC 163 (H) 01/16/2023   CREATININE 0.81 01/16/2023   INSULIN  Date Value Ref Range Status  01/16/2023 10.1 2.6 - 24.9 uIU/mL Final  The importance of fasting labs to assess insulin levels and lipid profile was discussed. She expressed a goal of not being diabetic by the next blood work assessment. - Schedule fasting labs in May to assess blood glucose control, insulin levels, and lipid profile. - Continue dietary modifications to reduce sugar intake and improve blood glucose control. She is working  on nutrition plan to decrease simple carbohydrates, increase lean proteins and exercise to promote weight loss and improve glycemic control .  Hyperlipidemia Deborah Miranda's lipid levels need monitoring as part of her overall health management. The impact of dietary choices, particularly cheese and nuts, on lipid levels was discussed.  Lab Results  Component Value Date   CHOL 246 (H) 01/16/2023   CHOL 204 (H) 12/19/2018   CHOL 250 (H) 09/10/2018   Lab Results  Component Value Date   HDL 68 01/16/2023   HDL 52 12/19/2018   HDL 65 09/10/2018   Lab Results  Component Value Date   LDLCALC 163 (H) 01/16/2023   LDLCALC 134 (H) 12/19/2018   LDLCALC 160 (H) 09/10/2018   Lab Results  Component Value Date   TRIG 85 01/16/2023   TRIG 98 12/19/2018   TRIG 127 09/10/2018   Lab Results  Component Value Date   CHOLHDL 3.8 07/05/2018   CHOLHDL 3.3 06/12/2017   CHOLHDL 3.8 12/09/2015   No results found for: "LDLDIRECT" The importance of fasting labs to evaluate lipid profile was emphasized. - Schedule fasting labs in May to assess lipid profile. - Monitor dietary intake of cheese and nuts to manage lipid levels. Continue to work on nutrition plan  -decreasing simple carbohydrates, increasing lean proteins, decreasing saturated fats and cholesterol , avoiding trans fats and exercise as able to promote weight loss, improve lipids and decrease cardiovascular risks.  Vitamin D Deficiency Vitamin D is not at goal of 50.  Most recent vitamin D level was 33.1. She is on  prescription ergocalciferol 50,000 IU weekly. Lab Results  Component Value Date   VD25OH 33.1 01/16/2023   VD25OH 41.2 08/10/2020   VD25OH 70.9 12/19/2018    Plan: Continue and refill  prescription ergocalciferol 50,000 IU weekly Low vitamin D levels can be associated with adiposity and may result in leptin resistance and weight gain. Also associated with fatigue.  Currently on vitamin D supplementation without any adverse effects such as nausea, vomiting or muscle weakness.    General Health Maintenance Deborah Miranda is actively engaged in lifestyle modifications to improve her overall health. Strategies for managing hunger and making healthier food choices were discussed. The importance of maintaining muscle mass and reducing body fat for better health  outcomes was emphasized. She is encouraged to incorporate physical activity as feasible, considering personal and caregiving responsibilities. - Continue lifestyle modifications to support weight loss and overall health. - Incorporate physical activity as feasible, considering personal and caregiving responsibilities.  Follow-up Deborah Miranda needs to schedule a follow-up appointment for fasting labs in May. Coordination with her work schedule and caregiving responsibilities is necessary. - Call and schedule a follow-up appointment in May for fasting labs.  Vitals Temp: 98 F (36.7 C) BP: 130/82 Pulse Rate: 68 SpO2: 99 %   Anthropometric Measurements Height: 5' 3.5" (1.613 m) Weight: 150 lb (68 kg) BMI (Calculated): 26.15 Weight at Last Visit: 154 lb Weight Lost Since Last Visit: 4 lb Weight Gained Since Last Visit: 0  lb Starting Weight: 164 lb Total Weight Loss (lbs): 14 lb (6.35 kg)   Body Composition  Body Fat %: 32.2 % Fat Mass (lbs): 48.4 lbs Muscle Mass (lbs): 96.6 lbs Total Body Water (lbs): 65.2 lbs Visceral Fat Rating : 8   Other Clinical Data Fasting: no Labs: no Today's Visit #: 4 Starting Date: 01/16/23     ASSESSMENT AND PLAN:  Diet: Deborah Miranda is currently in the action stage of change. As such, her goal is to continue with weight loss efforts. She has agreed to [employer].  Exercise: Deborah Miranda has been instructed that some exercise is better than none for weight loss and overall health benefits.   Behavior Modification:  We discussed the following Behavioral Modification Strategies today: increasing lean protein intake, decreasing simple carbohydrates, increasing vegetables, increase H2O intake, increase high fiber foods, better snacking choices, emotional eating strategies , avoiding temptations, and planning for success. We discussed various medication options to help Deborah Miranda with her weight loss efforts and we both agreed to continue to work on nutritional and behavioral strategies to promote weight loss.  .  Return in about 6 weeks (around 06/11/2023) for Fasting Lab.[name] She was informed of the importance of frequent follow up visits to maximize her success with intensive lifestyle modifications for her multiple health conditions.  Attestation Statements:   Reviewed by clinician on day of visit: allergies, medications, problem list, medical history, surgical history, family history, social history, and previous encounter notes.   Time spent on visit including pre-visit chart review and post-visit care and charting was 32 minutes.    Simone Tuckey, PA-C

## 2023-04-30 ENCOUNTER — Encounter (INDEPENDENT_AMBULATORY_CARE_PROVIDER_SITE_OTHER): Payer: Self-pay | Admitting: Physician Assistant

## 2023-04-30 ENCOUNTER — Ambulatory Visit (INDEPENDENT_AMBULATORY_CARE_PROVIDER_SITE_OTHER): Payer: BC Managed Care – PPO | Admitting: Physician Assistant

## 2023-04-30 VITALS — BP 130/82 | HR 68 | Temp 98.0°F | Ht 63.5 in | Wt 150.0 lb

## 2023-04-30 DIAGNOSIS — E1169 Type 2 diabetes mellitus with other specified complication: Secondary | ICD-10-CM

## 2023-04-30 DIAGNOSIS — E785 Hyperlipidemia, unspecified: Secondary | ICD-10-CM | POA: Diagnosis not present

## 2023-04-30 DIAGNOSIS — Z6826 Body mass index (BMI) 26.0-26.9, adult: Secondary | ICD-10-CM

## 2023-04-30 DIAGNOSIS — E559 Vitamin D deficiency, unspecified: Secondary | ICD-10-CM

## 2023-04-30 DIAGNOSIS — E66811 Obesity, class 1: Secondary | ICD-10-CM

## 2023-04-30 DIAGNOSIS — Z794 Long term (current) use of insulin: Secondary | ICD-10-CM

## 2023-04-30 DIAGNOSIS — E669 Obesity, unspecified: Secondary | ICD-10-CM

## 2023-04-30 MED ORDER — VITAMIN D (ERGOCALCIFEROL) 1.25 MG (50000 UNIT) PO CAPS: 50000.0000 [IU] | ORAL_CAPSULE | ORAL | 0 refills | Status: DC

## 2023-05-08 DIAGNOSIS — R7309 Other abnormal glucose: Secondary | ICD-10-CM | POA: Diagnosis not present

## 2023-06-07 DIAGNOSIS — R7309 Other abnormal glucose: Secondary | ICD-10-CM | POA: Diagnosis not present

## 2023-07-08 DIAGNOSIS — R7309 Other abnormal glucose: Secondary | ICD-10-CM | POA: Diagnosis not present

## 2023-07-26 ENCOUNTER — Encounter (INDEPENDENT_AMBULATORY_CARE_PROVIDER_SITE_OTHER): Payer: Self-pay | Admitting: Family Medicine

## 2023-07-26 ENCOUNTER — Ambulatory Visit (INDEPENDENT_AMBULATORY_CARE_PROVIDER_SITE_OTHER): Admitting: Family Medicine

## 2023-07-26 VITALS — BP 124/68 | HR 56 | Temp 97.4°F | Ht 63.5 in | Wt 148.0 lb

## 2023-07-26 DIAGNOSIS — E559 Vitamin D deficiency, unspecified: Secondary | ICD-10-CM | POA: Diagnosis not present

## 2023-07-26 DIAGNOSIS — Z6825 Body mass index (BMI) 25.0-25.9, adult: Secondary | ICD-10-CM

## 2023-07-26 DIAGNOSIS — E785 Hyperlipidemia, unspecified: Secondary | ICD-10-CM

## 2023-07-26 DIAGNOSIS — E669 Obesity, unspecified: Secondary | ICD-10-CM

## 2023-07-26 DIAGNOSIS — E038 Other specified hypothyroidism: Secondary | ICD-10-CM

## 2023-07-26 DIAGNOSIS — E119 Type 2 diabetes mellitus without complications: Secondary | ICD-10-CM | POA: Diagnosis not present

## 2023-07-26 DIAGNOSIS — E66811 Obesity, class 1: Secondary | ICD-10-CM

## 2023-07-26 DIAGNOSIS — E039 Hypothyroidism, unspecified: Secondary | ICD-10-CM | POA: Diagnosis not present

## 2023-07-26 DIAGNOSIS — E1169 Type 2 diabetes mellitus with other specified complication: Secondary | ICD-10-CM | POA: Diagnosis not present

## 2023-07-26 DIAGNOSIS — Z794 Long term (current) use of insulin: Secondary | ICD-10-CM

## 2023-07-26 NOTE — Progress Notes (Signed)
 SUBJECTIVE:  Chief Complaint: Obesity  Interim History: Patient normally sees Shawn.  She has recently gotten past the A1c into diabetes.  She has occasionally incorporated potatoes and carbohydrates back into her intake. Over the next few weeks she has a vacation which she will go up to [place] to see her children.    Deborah Miranda is here to discuss her progress with her obesity treatment plan. She is on the [employer] and states she is following her eating plan approximately 60-65 % of the time. She states she is exercising 35-40 minutes 5 times per week- she walks everyday at lunch.   OBJECTIVE: Visit Diagnoses: Problem List Items Addressed This Visit       Endocrine   Hypothyroidism   No cold intolerance, heat intolerance or palpitations.  On levothyroxine  88mcg daily.  Will repeat TSH today and make medications adjustments if needed.      Relevant Orders   TSH (Completed)   Diabetes (HCC) - Primary   Patient is focusing on being mindful of carb intake daily.  Will order CMP, A1c and Insulin  level today.  Plan to discuss lab results at next appointment.  Discussed other higher protein options for pescatarian plan to substitute for some of the products in meal options.      Relevant Orders   Comprehensive metabolic panel with GFR (Completed)   Hemoglobin A1c (Completed)   Insulin , random (Completed)   Hyperlipidemia associated with type 2 diabetes mellitus (HCC)   LDL elevated at last labs in February.  Needs a repeat FLP today.  Discussed importance of limiting saturated fat intake to less than 20% of total intake daily.      Relevant Orders   Lipid Panel With LDL/HDL Ratio (Completed)     Other   Obesity (BMI 30.0-34.9)   Vitamin D  deficiency   On prescription strength Vitamin D  previously.  Needs a repeat level today.      Relevant Orders   VITAMIN D  25 Hydroxy (Vit-D Deficiency, Fractures) (Completed)   Other Visit Diagnoses       BMI 25.0-25.9,adult            Vitals Temp: (!) 97.4 F (36.3 C) BP: 124/68 Pulse Rate: (!) 56 SpO2: 96 %   Anthropometric Measurements Height: 5' 3.5 (1.613 m) Weight: 148 lb (67.1 kg) BMI (Calculated): 25.8 Weight at Last Visit: 150 lb Weight Lost Since Last Visit: 2 lb Weight Gained Since Last Visit: 0 Starting Weight: 164 lb Total Weight Loss (lbs): 12 lb (5.443 kg)   Body Composition  Body Fat %: 32 % Fat Mass (lbs): 47.6 lbs Muscle Mass (lbs): 96 lbs Total Body Water (lbs): 65.8 lbs Visceral Fat Rating : 8   Other Clinical Data Fasting: no Labs: no Today's Visit #: 5 Starting Date: 01/16/23     ASSESSMENT AND PLAN:  Diet: Deborah Miranda is currently in the action stage of change. As such, her goal is to continue with weight loss efforts and has agreed to the [employer].   Exercise:  For substantial health benefits, adults should do at least 150 minutes (2 hours and 30 minutes) a week of moderate-intensity, or 75 minutes (1 hour and 15 minutes) a week of vigorous-intensity aerobic physical activity, or an equivalent combination of moderate- and vigorous-intensity aerobic activity. Aerobic activity should be performed in episodes of at least 10 minutes, and preferably, it should be spread throughout the week.  Behavior Modification:  We discussed the following Behavioral Modification Strategies today: increasing lean  protein intake, decreasing simple carbohydrates, increasing vegetables, meal planning and cooking strategies, keeping healthy foods in the home, and planning for success.   Return in about 3 weeks (around 08/16/2023) for with Shawn.   She was informed of the importance of frequent follow up visits to maximize her success with intensive lifestyle modifications for her multiple health conditions.  Attestation Statements:   Reviewed by clinician on day of visit: allergies, medications, problem list, medical history, surgical history, family history, social history, and  previous encounter notes.   Donaciano Frizzle, MD

## 2023-07-27 LAB — COMPREHENSIVE METABOLIC PANEL WITH GFR
ALT: 13 IU/L (ref 0–32)
AST: 16 IU/L (ref 0–40)
Albumin: 4.5 g/dL (ref 3.9–4.9)
Alkaline Phosphatase: 66 IU/L (ref 44–121)
BUN/Creatinine Ratio: 24 (ref 12–28)
BUN: 17 mg/dL (ref 8–27)
Bilirubin Total: 1 mg/dL (ref 0.0–1.2)
CO2: 24 mmol/L (ref 20–29)
Calcium: 9.7 mg/dL (ref 8.7–10.3)
Chloride: 101 mmol/L (ref 96–106)
Creatinine, Ser: 0.72 mg/dL (ref 0.57–1.00)
Globulin, Total: 2.6 g/dL (ref 1.5–4.5)
Glucose: 97 mg/dL (ref 70–99)
Potassium: 4.4 mmol/L (ref 3.5–5.2)
Sodium: 141 mmol/L (ref 134–144)
Total Protein: 7.1 g/dL (ref 6.0–8.5)
eGFR: 93 mL/min/{1.73_m2} (ref >59)

## 2023-07-27 LAB — HEMOGLOBIN A1C
Est. average glucose Bld gHb Est-mCnc: 131 mg/dL
Hgb A1c MFr Bld: 6.2 % — ABNORMAL HIGH (ref 4.8–5.6)

## 2023-07-27 LAB — LIPID PANEL WITH LDL/HDL RATIO
Cholesterol, Total: 237 mg/dL — ABNORMAL HIGH (ref 100–199)
HDL: 66 mg/dL (ref >39)
LDL Chol Calc (NIH): 159 mg/dL — ABNORMAL HIGH (ref 0–99)
LDL/HDL Ratio: 2.4 ratio (ref 0.0–3.2)
Triglycerides: 71 mg/dL (ref 0–149)
VLDL Cholesterol Cal: 12 mg/dL (ref 5–40)

## 2023-07-27 LAB — TSH: TSH: 0.714 u[IU]/mL (ref 0.450–4.500)

## 2023-07-27 LAB — VITAMIN D 25 HYDROXY (VIT D DEFICIENCY, FRACTURES): Vit D, 25-Hydroxy: 50.7 ng/mL (ref 30.0–100.0)

## 2023-07-27 LAB — INSULIN, RANDOM: INSULIN: 8.8 u[IU]/mL (ref 2.6–24.9)

## 2023-07-27 NOTE — Assessment & Plan Note (Signed)
 Patient is focusing on being mindful of carb intake daily.  Will order CMP, A1c and Insulin  level today.  Plan to discuss lab results at next appointment.  Discussed other higher protein options for pescatarian plan to substitute for some of the products in meal options.

## 2023-07-27 NOTE — Assessment & Plan Note (Signed)
 LDL elevated at last labs in February.  Needs a repeat FLP today.  Discussed importance of limiting saturated fat intake to less than 20% of total intake daily.

## 2023-07-27 NOTE — Assessment & Plan Note (Signed)
 On prescription strength Vitamin D  previously.  Needs a repeat level today.

## 2023-07-27 NOTE — Assessment & Plan Note (Signed)
 No cold intolerance, heat intolerance or palpitations.  On levothyroxine  88mcg daily.  Will repeat TSH today and make medications adjustments if needed.

## 2023-08-07 DIAGNOSIS — R7309 Other abnormal glucose: Secondary | ICD-10-CM | POA: Diagnosis not present

## 2023-08-22 ENCOUNTER — Other Ambulatory Visit (INDEPENDENT_AMBULATORY_CARE_PROVIDER_SITE_OTHER): Payer: Self-pay | Admitting: Physician Assistant

## 2023-08-22 DIAGNOSIS — E559 Vitamin D deficiency, unspecified: Secondary | ICD-10-CM

## 2023-09-12 ENCOUNTER — Telehealth (INDEPENDENT_AMBULATORY_CARE_PROVIDER_SITE_OTHER): Payer: Self-pay | Admitting: Physician Assistant

## 2023-09-12 NOTE — Telephone Encounter (Signed)
 8/6 pt called said her vitamin D  was denied

## 2023-10-01 ENCOUNTER — Ambulatory Visit (INDEPENDENT_AMBULATORY_CARE_PROVIDER_SITE_OTHER): Admitting: Physician Assistant

## 2023-10-17 ENCOUNTER — Encounter (INDEPENDENT_AMBULATORY_CARE_PROVIDER_SITE_OTHER): Payer: Self-pay | Admitting: Family Medicine

## 2023-10-17 ENCOUNTER — Ambulatory Visit (INDEPENDENT_AMBULATORY_CARE_PROVIDER_SITE_OTHER): Admitting: Family Medicine

## 2023-10-17 VITALS — BP 158/80 | HR 61 | Temp 97.7°F | Ht 63.5 in | Wt 148.0 lb

## 2023-10-17 DIAGNOSIS — E1169 Type 2 diabetes mellitus with other specified complication: Secondary | ICD-10-CM | POA: Diagnosis not present

## 2023-10-17 DIAGNOSIS — E559 Vitamin D deficiency, unspecified: Secondary | ICD-10-CM

## 2023-10-17 DIAGNOSIS — I152 Hypertension secondary to endocrine disorders: Secondary | ICD-10-CM

## 2023-10-17 DIAGNOSIS — E669 Obesity, unspecified: Secondary | ICD-10-CM

## 2023-10-17 DIAGNOSIS — E1159 Type 2 diabetes mellitus with other circulatory complications: Secondary | ICD-10-CM | POA: Diagnosis not present

## 2023-10-17 DIAGNOSIS — E785 Hyperlipidemia, unspecified: Secondary | ICD-10-CM

## 2023-10-17 DIAGNOSIS — Z6825 Body mass index (BMI) 25.0-25.9, adult: Secondary | ICD-10-CM

## 2023-10-17 DIAGNOSIS — E66811 Obesity, class 1: Secondary | ICD-10-CM

## 2023-10-17 MED ORDER — VITAMIN D (ERGOCALCIFEROL) 1.25 MG (50000 UNIT) PO CAPS: 50000.0000 [IU] | ORAL_CAPSULE | ORAL | 0 refills | Status: AC

## 2023-10-17 MED ORDER — LOSARTAN POTASSIUM 25 MG PO TABS
25.0000 mg | ORAL_TABLET | Freq: Every day | ORAL | 0 refills | Status: AC
Start: 2023-10-17 — End: ?

## 2023-10-17 NOTE — Progress Notes (Signed)
 SUBJECTIVE:  Chief Complaint: Obesity  Interim History: Patient recently got a new dog- [employer] [place] and she is working on training him.  She is still following pescatarian plan 65-70% of the time.  She is following breakfast 100%, lunch is fairly consistent and dinner is hit or miss as she may eat what her family is eating because of time. Next month and a half is hectic for her.  She is going through a branch closure currently.  She mentions she is spread very thin.  Deborah Miranda is here to discuss her progress with her obesity treatment plan. She is on the [employer] and states she is following her eating plan approximately 65-70 % of the time. She states she is walking45 minutes 5 times per week.   OBJECTIVE: Visit Diagnoses: Problem List Items Addressed This Visit       Endocrine   Hyperlipidemia associated with type 2 diabetes mellitus (HCC)   Relevant Medications   losartan  (COZAAR ) 25 MG tablet     Other   Obesity (BMI 30.0-34.9)   Vitamin D  deficiency   Relevant Medications   Vitamin D , Ergocalciferol , (DRISDOL ) 1.25 MG (50000 UNIT) CAPS capsule   Other Visit Diagnoses       Hypertension associated with diabetes (HCC)    -  Primary   Relevant Medications   losartan  (COZAAR ) 25 MG tablet     BMI 25.0-25.9,adult           Vitals Temp: 97.7 F (36.5 C) BP: (!) 158/80 Pulse Rate: 61 SpO2: 100 %   Anthropometric Measurements Height: 5' 3.5 (1.613 m) Weight: 148 lb (67.1 kg) BMI (Calculated): 25.8 Weight at Last Visit: 148 lb Weight Lost Since Last Visit: 0 Weight Gained Since Last Visit: 0 Starting Weight: 164 lb Total Weight Loss (lbs): 16 lb (7.258 kg)   Body Composition  Body Fat %: 31.6 % Fat Mass (lbs): 46.8 lbs Muscle Mass (lbs): 96.2 lbs Total Body Water (lbs): 66 lbs Visceral Fat Rating : 8   Other Clinical Data Today's Visit #: 6 Starting Date: 01/16/23 Comments: Pesc     ASSESSMENT AND PLAN: / Assessment &  Plan Hypertension associated with diabetes (HCC) Blood pressure elevated today at 158/80.  No chest pain chest pressure or headache reported.  Blood pressure previously better controlled though labile.  Needs refill of losartan  today. Hyperlipidemia associated with type 2 diabetes mellitus (HCC) The 10-year ASCVD risk score (Arnett DK, et al., 2019) is: 15.6%   Values used to calculate the score:     Age: 63 years     Clincally relevant sex: Female     Is Non-Hispanic African American: No     Diabetic: Yes     Tobacco smoker: No     Systolic Blood Pressure: 158 mmHg     Is BP treated: No     HDL Cholesterol: 66 mg/dL     Total Cholesterol: 237 mg/dL Discussed 89-bzjm ASCVD risk of 15.6%.  Discussed treatment strategies for cholesterol which include dietary and lifestyle modifications as well as pharmacotherapy.  Patient would like to defer pharmacotherapy at this time. Obesity (BMI 30.0-34.9) 09/10/18 starting bmi 30.11  BMI 25.0-25.9,adult  Vitamin D  deficiency Due to refill of vitamin D  today.  No nausea, vomiting, or muscle weakness reported.  Continue prescription strength vitamin D .   Diet: Tamryn is currently in the action stage of change. As such, her goal is to continue with weight loss efforts and has agreed to the [employer].  Exercise:  For additional and more extensive health benefits, adults should increase their aerobic physical activity to 300 minutes (5 hours) a week of moderate-intensity, or 150 minutes a week of vigorous-intensity aerobic physical activity, or an equivalent combination of moderate- and vigorous-intensity activity. Additional health benefits are gained by engaging in physical activity beyond this amount.   Behavior Modification:  We discussed the following Behavioral Modification Strategies today: increasing lean protein intake, decreasing simple carbohydrates, increasing vegetables, meal planning and cooking strategies, keeping healthy foods in  the home, and planning for success.   Return in about 9 weeks (around 12/19/2023).   She was informed of the importance of frequent follow up visits to maximize her success with intensive lifestyle modifications for her multiple health conditions.  Attestation Statements:   Reviewed by clinician on day of visit: allergies, medications, problem list, medical history, surgical history, family history, social history, and previous encounter notes.     Adelita Cho, MD

## 2023-10-17 NOTE — Assessment & Plan Note (Addendum)
 The 10-year ASCVD risk score (Arnett DK, et al., 2019) is: 15.6%   Values used to calculate the score:     Age: 63 years     Clincally relevant sex: Female     Is Non-Hispanic African American: No     Diabetic: Yes     Tobacco smoker: No     Systolic Blood Pressure: 158 mmHg     Is BP treated: No     HDL Cholesterol: 66 mg/dL     Total Cholesterol: 237 mg/dL Discussed 89-bzjm ASCVD risk of 15.6%.  Discussed treatment strategies for cholesterol which include dietary and lifestyle modifications as well as pharmacotherapy.  Patient would like to defer pharmacotherapy at this time.

## 2023-10-19 ENCOUNTER — Encounter (HOSPITAL_BASED_OUTPATIENT_CLINIC_OR_DEPARTMENT_OTHER): Payer: Self-pay | Admitting: Obstetrics & Gynecology

## 2023-10-19 ENCOUNTER — Encounter (HOSPITAL_BASED_OUTPATIENT_CLINIC_OR_DEPARTMENT_OTHER): Payer: Self-pay

## 2023-10-29 NOTE — Assessment & Plan Note (Signed)
 Due to refill of vitamin D  today.  No nausea, vomiting, or muscle weakness reported.  Continue prescription strength vitamin D .

## 2023-12-21 ENCOUNTER — Ambulatory Visit (HOSPITAL_BASED_OUTPATIENT_CLINIC_OR_DEPARTMENT_OTHER): Admitting: Obstetrics & Gynecology

## 2024-01-09 ENCOUNTER — Ambulatory Visit (INDEPENDENT_AMBULATORY_CARE_PROVIDER_SITE_OTHER): Payer: Self-pay | Admitting: Family Medicine

## 2024-01-13 ENCOUNTER — Other Ambulatory Visit (HOSPITAL_BASED_OUTPATIENT_CLINIC_OR_DEPARTMENT_OTHER): Payer: Self-pay | Admitting: Obstetrics & Gynecology

## 2024-01-13 ENCOUNTER — Other Ambulatory Visit (INDEPENDENT_AMBULATORY_CARE_PROVIDER_SITE_OTHER): Payer: Self-pay | Admitting: Family Medicine

## 2024-01-13 DIAGNOSIS — E039 Hypothyroidism, unspecified: Secondary | ICD-10-CM

## 2024-01-13 DIAGNOSIS — E559 Vitamin D deficiency, unspecified: Secondary | ICD-10-CM

## 2024-01-13 DIAGNOSIS — E1159 Type 2 diabetes mellitus with other circulatory complications: Secondary | ICD-10-CM

## 2024-01-29 DIAGNOSIS — L570 Actinic keratosis: Secondary | ICD-10-CM | POA: Diagnosis not present

## 2024-02-05 ENCOUNTER — Other Ambulatory Visit (INDEPENDENT_AMBULATORY_CARE_PROVIDER_SITE_OTHER): Payer: Self-pay | Admitting: Family Medicine

## 2024-02-05 DIAGNOSIS — I152 Hypertension secondary to endocrine disorders: Secondary | ICD-10-CM

## 2024-03-04 ENCOUNTER — Ambulatory Visit (HOSPITAL_BASED_OUTPATIENT_CLINIC_OR_DEPARTMENT_OTHER): Admitting: Obstetrics & Gynecology
